# Patient Record
Sex: Female | Born: 1980
Health system: Southern US, Community
[De-identification: ages and names within clinical notes are randomized; demographics above are authoritative.]

## PROBLEM LIST (undated history)

## (undated) DIAGNOSIS — Z8669 Personal history of other diseases of the nervous system and sense organs: Secondary | ICD-10-CM

## (undated) DIAGNOSIS — G43909 Migraine, unspecified, not intractable, without status migrainosus: Secondary | ICD-10-CM

## (undated) DIAGNOSIS — Z8709 Personal history of other diseases of the respiratory system: Secondary | ICD-10-CM

## (undated) DIAGNOSIS — L709 Acne, unspecified: Secondary | ICD-10-CM

## (undated) DIAGNOSIS — F419 Anxiety disorder, unspecified: Secondary | ICD-10-CM

## (undated) DIAGNOSIS — Z8659 Personal history of other mental and behavioral disorders: Secondary | ICD-10-CM

## (undated) DIAGNOSIS — Z8619 Personal history of other infectious and parasitic diseases: Secondary | ICD-10-CM

## (undated) DIAGNOSIS — J45909 Unspecified asthma, uncomplicated: Secondary | ICD-10-CM

## (undated) DIAGNOSIS — T7840XA Allergy, unspecified, initial encounter: Secondary | ICD-10-CM

## (undated) DIAGNOSIS — N898 Other specified noninflammatory disorders of vagina: Secondary | ICD-10-CM

## (undated) DIAGNOSIS — O24419 Gestational diabetes mellitus in pregnancy, unspecified control: Secondary | ICD-10-CM

## (undated) DIAGNOSIS — F32A Depression, unspecified: Secondary | ICD-10-CM

## (undated) DIAGNOSIS — F329 Major depressive disorder, single episode, unspecified: Secondary | ICD-10-CM

## (undated) HISTORY — DX: Acne, unspecified: L70.9

## (undated) HISTORY — DX: Personal history of other diseases of the respiratory system: Z87.09

## (undated) HISTORY — DX: Personal history of other mental and behavioral disorders: Z86.59

## (undated) HISTORY — DX: Migraine, unspecified, not intractable, without status migrainosus: G43.909

## (undated) HISTORY — PX: WISDOM TOOTH EXTRACTION: SHX21

## (undated) HISTORY — DX: Depression, unspecified: F32.A

## (undated) HISTORY — DX: Other specified noninflammatory disorders of vagina: N89.8

## (undated) HISTORY — PX: TONSILLECTOMY: SUR1361

## (undated) HISTORY — DX: Personal history of other diseases of the nervous system and sense organs: Z86.69

## (undated) HISTORY — DX: Allergy, unspecified, initial encounter: T78.40XA

## (undated) HISTORY — DX: Personal history of other infectious and parasitic diseases: Z86.19

## (undated) HISTORY — DX: Unspecified asthma, uncomplicated: J45.909

## (undated) HISTORY — PX: ADENOIDECTOMY: SUR15

## (undated) HISTORY — DX: Gestational diabetes mellitus in pregnancy, unspecified control: O24.419

## (undated) HISTORY — DX: Major depressive disorder, single episode, unspecified: F32.9

## (undated) HISTORY — DX: Anxiety disorder, unspecified: F41.9

---

## 2007-06-05 ENCOUNTER — Ambulatory Visit: Payer: Self-pay | Admitting: Obstetrics and Gynecology

## 2007-06-26 HISTORY — PX: CHOLECYSTECTOMY: SHX55

## 2007-07-07 ENCOUNTER — Encounter: Payer: Self-pay | Admitting: Maternal & Fetal Medicine

## 2007-08-04 ENCOUNTER — Encounter: Payer: Self-pay | Admitting: Obstetrics and Gynecology

## 2007-10-20 ENCOUNTER — Encounter: Payer: Self-pay | Admitting: Maternal & Fetal Medicine

## 2007-12-11 ENCOUNTER — Encounter: Payer: Self-pay | Admitting: Maternal & Fetal Medicine

## 2007-12-25 ENCOUNTER — Encounter: Payer: Self-pay | Admitting: Obstetrics & Gynecology

## 2008-01-01 ENCOUNTER — Ambulatory Visit: Payer: Self-pay | Admitting: Obstetrics and Gynecology

## 2008-01-02 ENCOUNTER — Inpatient Hospital Stay: Payer: Self-pay | Admitting: Obstetrics and Gynecology

## 2008-01-25 ENCOUNTER — Other Ambulatory Visit: Payer: Self-pay

## 2008-01-25 ENCOUNTER — Emergency Department: Payer: Self-pay | Admitting: Emergency Medicine

## 2008-02-20 ENCOUNTER — Ambulatory Visit: Payer: Self-pay | Admitting: General Surgery

## 2008-02-23 ENCOUNTER — Ambulatory Visit: Payer: Self-pay | Admitting: General Surgery

## 2008-10-30 ENCOUNTER — Emergency Department: Payer: Self-pay | Admitting: Emergency Medicine

## 2011-02-15 ENCOUNTER — Encounter: Payer: Self-pay | Admitting: Obstetrics and Gynecology

## 2011-03-26 ENCOUNTER — Encounter: Payer: Self-pay | Admitting: Maternal and Fetal Medicine

## 2011-07-02 ENCOUNTER — Observation Stay: Payer: Self-pay | Admitting: Obstetrics and Gynecology

## 2011-07-02 LAB — URINALYSIS, COMPLETE
Bacteria: NONE SEEN
Bilirubin,UR: NEGATIVE
Blood: NEGATIVE
Glucose,UR: NEGATIVE mg/dL (ref 0–75)
Ketone: NEGATIVE
Leukocyte Esterase: NEGATIVE
Ph: 7 (ref 4.5–8.0)
Specific Gravity: 1.003 (ref 1.003–1.030)
Squamous Epithelial: 1

## 2011-07-02 LAB — FETAL FIBRONECTIN: Fetal Fibronectin: NEGATIVE

## 2011-07-25 ENCOUNTER — Observation Stay: Payer: Self-pay | Admitting: Obstetrics and Gynecology

## 2011-08-10 ENCOUNTER — Ambulatory Visit: Payer: Self-pay | Admitting: Obstetrics and Gynecology

## 2011-08-10 LAB — CBC WITH DIFFERENTIAL/PLATELET
Basophil #: 0 10*3/uL (ref 0.0–0.1)
Basophil %: 0.1 %
Eosinophil #: 0 10*3/uL (ref 0.0–0.7)
HCT: 39.2 % (ref 35.0–47.0)
Lymphocyte %: 15.9 %
MCHC: 33.7 g/dL (ref 32.0–36.0)
MCV: 89 fL (ref 80–100)
Monocyte #: 0.7 10*3/uL (ref 0.0–0.7)
Neutrophil #: 9.8 10*3/uL — ABNORMAL HIGH (ref 1.4–6.5)
RDW: 12.6 % (ref 11.5–14.5)
WBC: 12.5 10*3/uL — ABNORMAL HIGH (ref 3.6–11.0)

## 2011-08-13 ENCOUNTER — Inpatient Hospital Stay: Payer: Self-pay | Admitting: Obstetrics and Gynecology

## 2011-08-14 LAB — HEMATOCRIT: HCT: 32.2 % — ABNORMAL LOW (ref 35.0–47.0)

## 2013-07-02 LAB — HM PAP SMEAR: HM PAP: NEGATIVE

## 2014-10-17 NOTE — H&P (Signed)
PATIENT NAME:  Sherry Lara, Sherry Lara DATE OF BIRTH:  12/18/1980  DATE OF ADMISSION:  08/13/2011  PREOPERATIVE DIAGNOSES:  1. 39.1 week intrauterine pregnancy, undelivered.  2. History of previous Cesarean section x2.  3. 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  4. Anxiety/depression.   HISTORY OF PRESENT ILLNESS: Sherry Lara is a 34 year old married white female gravida 4, para 1-1-0-2, EDC 08/19/2011, EGA 39.[redacted] weeks gestation, who presents for repeat Cesarean section delivery. The patient's prenatal course has been notable for previous Cesarean section x2; first Cesarean section  was done for arrest of descent for 7 pound 7 ounce baby who was OP at delivery; second delivery was a repeat Cesarean section. The patient also has history of robertsonian translocation on chromosomes 13/14. Nuchal translucency testing was within normal limits. The patient does have anxiety/depression and has had increasing issues as the delivery is getting closer. The patient will require close monitoring of this problem at the time of Cesarean section delivery.   PAST MEDICAL HISTORY:  1. Migraines.  2. Asthma.  3. Anxiety/depression.  4. 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  5. History of gestational diabetes first baby.   PAST SURGICAL HISTORY:  1. Dilation and curettage. 2. Tonsillectomy and adenoidectomy.  3. Wisdom tooth extraction. 4. Cesarean section x2.  5. Laparoscopic cholecystectomy in 2009.   PAST OB HISTORY: Para 1-1-0-2.   FAMILY HISTORY: Negative for genetic issues other than the robertsonian translocation on chromosomes 13/14.   SOCIAL HISTORY: The patient does not smoke, does not drink, does not use drugs.   CURRENT MEDICATIONS: Prenatal vitamins.   DRUG ALLERGIES: Codeine, penicillin, Vicodin.   REVIEW OF SYSTEMS: The patient denies recent illness. The patient does not have history of coagulopathy.   PHYSICAL EXAMINATION:   VITAL  SIGNS: Height 5 feet 4 inches, weight 172.4, blood pressure 112/74, heart rate 84.   GENERAL: The patient is a pleasant well appearing white female who is anxious. She is alert and oriented.   NECK: Supple without thyromegaly or adenopathy.   LUNGS: Clear.   HEART: Regular rate and rhythm without murmur.   ABDOMEN: Soft, gravid, fundal height 35 cm, estimated fetal weight 7 pounds 4 ounces. Fetal heart tones are normal at 145. Cervix is closed 30% pelvis.   EXTREMITIES: Without clubbing, cyanosis, or edema.   SKIN: Without rash.   IMPRESSION:  1. 39.1 week intrauterine pregnancy, undelivered.  2. Previous Cesarean section delivery x2.  3. Anxiety/depression, worsening.   PLAN:  1. Repeat Cesarean section delivery.  2. Consider pharmacologic therapy for postpartum depression prophylaxis.  3. Date of surgery is 08/13/2011.   CONSENT NOTE: Sherry Lara is to undergo repeat Cesarean section delivery. She is understanding of the planned procedure and is aware of and accepting of all surgical risks which include, but are not limited to, bleeding, infection, pelvic organ injury with need for repair, blood clot disorders, anesthesia risks, and death. All questions are answered. Informed consent is given. The patient is ready and willing to proceed with surgery as scheduled.   ____________________________ Prentice DockerMartin A. Asir Bingley, MD mad:drc D: 08/09/2011 14:46:20 ET T: 08/09/2011 15:53:28 ET JOB#: [id number]  cc: Daphine DeutscherMartin A. Kyndell Zeiser, MD, <Dictator> Prentice DockerMARTIN A Dorell Gatlin MD ELECTRONICALLY SIGNED 08/10/2011 14:41

## 2014-10-17 NOTE — Op Note (Signed)
PATIENT NAME:  Sherry Lara, Sherry Lara MR#:  [id number] DATE OF BIRTH:  08-23-1980  DATE OF PROCEDURE:  08/13/2011  PREOPERATIVE DIAGNOSES:  1. 39.1 week intrauterine pregnancy, undelivered.  2. Previous cesarean section delivery x2.  3. History of Robertsonian translocation chromosome 13/14. 4. History of anxiety.   POSTOPERATIVE DIAGNOSES:  1. 39.1 week intrauterine pregnancy, delivered.  2. Prior cesarean section x2.  3. History of Robertsonian translocation chromosomes 13/14. 4. History of anxiety.  5. Viable female infant 7 pounds, 8 ounces.   OPERATIVE PROCEDURE: Repeat low cervical transverse cesarean section.   SURGEON: Alanda Slim. DeFrancesco, MD   FIRST ASSISTANT: Elgie Collard, MD  ANESTHESIA: Spinal.   INDICATIONS: The patient is a 34 year old married white female gravida 4, para 1-1-0-2, at 39.[redacted] weeks gestation, with history of previous cesarean section x2, who presents for repeat cesarean section delivery. Her prenatal course has been relatively uneventful. The patient has been seen in psychology recently because of increased anxiety with pending surgical delivery.   FINDINGS AT SURGERY: Viable female infant 7 pounds, 8 ounces having Apgars of 8 and 9 at one and five minutes, respectively. There was a nuchal cord x1 noted. There was a significant thin window in the lower uterine segment that was identified. Cord blood and tissue were harvested, per the patient's request.   DESCRIPTION OF PROCEDURE: The patient was brought to the Operating Room where she was placed in the sitting position. Spinal anesthetic was introduced without difficulty. She was placed in the supine position with a right lateral hip roll in place. A ChloraPrep abdominal and perineal prep and drape was performed in the standard fashion. A Foley catheter was placed. After checking for adequate levels of anesthesia, the previous C-section scar was excised sharply. A scalpel was used to dissect down to the  fascia. The fascia was extended bilaterally with Mayo scissors. The rectus muscle was dissected off of the fascia through sharp and blunt dissection. Midline raphe was identified and separated, and the peritoneum was entered. A significant window was identified in the lower uterine segment with fetal head clearly visible. Just above the window, a low transverse incision was made in the uterus and this was extended bluntly bilaterally with care not to injure the infant. The incision was extended bluntly bilaterally. The infant was delivered through a vertex presentation with nuchal cord being reduced x1. The infant was vigorous at birth. Upon delivery of the infant, the umbilical cord was doubly clamped and cut and the infant was handed off to the resuscitating team. The cord segment was obtained for harvesting. Cord blood was then obtained per the kit directions, labeled appropriately and given to the patient for pick-up via the Cord [place]. The placenta was expressed from the uterine cavity. The uterus was externalized onto the anterior abdominal wall and cleared of all debris with laps. The uterine incision was closed in one layer using #1 chromic suture in a running locking manner. Good hemostasis was obtained. The tubes and ovaries were noted to be normal. The uterus was placed back into the abdominal pelvic cavity. Some serosa overlying the bladder was reapproximated using 2-0 Vicryl in a simple running manner. The gutters within the abdomen were cleared of all debris with laps. The incision was closed in layers with 0 Maxon being used on the fascia in a simple running manner. The skin was closed with staples, pressure dressing was applied, and the patient was then mobilized and taken to the Recovery Room in satisfactory condition. Estimated  blood loss was 500 mL. All instruments, needle, and sponge counts were verified as correct. The patient did receive Ancef 2 grams antibiotic prophylaxis  preoperatively.  ____________________________ Alanda Slim DeFrancesco, MD mad:slb D: 08/13/2011 16:37:00 ET T: 08/13/2011 16:58:49 ET JOB#: [id number]  cc: Hassell Done A. DeFrancesco, MD, <Dictator> Shelby Mattocks. Georga Bora, MD Alanda Slim DEFRANCESCO MD ELECTRONICALLY SIGNED 08/20/2011 16:02

## 2014-11-02 NOTE — H&P (Signed)
L&D Evaluation:  History:   HPI 36.3 week Intrauterine pregnancy; prior C/S x2    Presents with contractions    Patient's Medical History GDM last pregnancy    Patient's Surgical History Previous C-Section    Medications Pre Natal Vitamins    Allergies PCN, Codeine, Vicodin    Social History none    Family History Non-Contributory   ROS:   ROS All systems were reviewed.  HEENT, CNS, GI, GU, Respiratory, CV, Renal and Musculoskeletal systems were found to be normal.   Exam:   Vital Signs stable    Urine Protein not completed    General no apparent distress    Heart normal sinus rhythm    Abdomen gravid, non-tender    Estimated Fetal Weight Average for gestational age    Edema no edema    Pelvic no external lesions, 50/closed/-3    FHT Reactrive NST   Impression:   Impression 36.2 week Intrauterine pregnancy; Irregular Ctx's, no Labor; Prior C/S x 2   Plan:   Plan discharge    Follow Up Appointment Tomorrow as scheduled.   Electronic Signatures: Corlene Sabia, Prentice DockerMartin A (MD)  (Signed 30-Jan-13 10:20)  Authored: L&D Evaluation   Last Updated: 30-Jan-13 10:20 by Swade Shonka, Prentice DockerMartin A (MD)

## 2014-11-24 ENCOUNTER — Ambulatory Visit: Payer: Self-pay | Admitting: Primary Care

## 2014-11-24 ENCOUNTER — Ambulatory Visit (INDEPENDENT_AMBULATORY_CARE_PROVIDER_SITE_OTHER): Payer: BLUE CROSS/BLUE SHIELD | Admitting: Internal Medicine

## 2014-11-24 ENCOUNTER — Encounter: Payer: Self-pay | Admitting: Internal Medicine

## 2014-11-24 VITALS — BP 110/74 | HR 73 | Temp 98.3°F | Wt 141.0 lb

## 2014-11-24 DIAGNOSIS — J029 Acute pharyngitis, unspecified: Secondary | ICD-10-CM

## 2014-11-24 DIAGNOSIS — R52 Pain, unspecified: Secondary | ICD-10-CM

## 2014-11-24 LAB — POCT RAPID STREP A (OFFICE): Rapid Strep A Screen: NEGATIVE

## 2014-11-24 NOTE — Progress Notes (Signed)
Subjective:    Patient ID: Sherry Lara, female    DOB: 1980-12-30, 34 y.o.   MRN: [id number]  HPI  Pt presents to the clinic today with c/o sore throat and body aches. This started 4 days ago. She has noticed some swelling in her glands. She denies fever or chills. She reports she was treated for Strep throat back in April, and her son was recently treated for Strep 2 weeks ago. 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 She has no history of allergies or breathing problems. She has not tried anything OTC.  Review of Systems      No past medical history on file.  No current outpatient prescriptions on file.   No current facility-administered medications for this visit.    Allergies  Allergen Reactions  . Vicodin [Hydrocodone-Acetaminophen] Nausea And Vomiting  . Codeine Rash  . Penicillins Rash    No family history on file.  History   Social History  . Marital Status: Married    Spouse Name: N/A  . Number of Children: N/A  . Years of Education: N/A   Occupational History  . Not on file.   Social History Main Topics  . Smoking status: Never Smoker   . Smokeless tobacco: Not on file  . Alcohol Use: 0.0 oz/week    0 Standard drinks or equivalent per week     Comment: occasional  . Drug Use: Not on file  . Sexual Activity: Not on file   Other Topics Concern  . Not on file   Social History Narrative  . No narrative on file     Constitutional: Pt reports body aches. Denies fever, malaise, fatigue, headache or abrupt weight changes.  HEENT: Pt reports sore throat. Denies eye pain, eye redness, ear pain, ringing in the ears, wax buildup, runny nose, nasal congestion, bloody nose. Respiratory: Denies difficulty breathing, shortness of breath, cough or sputum production.   Cardiovascular: Denies chest pain, chest tightness, palpitations or swelling in the hands or feet.   No other specific complaints in a complete review of systems  (except as listed in HPI above).  Objective:   Physical Exam  BP 110/74 mmHg  Pulse 73  Temp(Src) 98.3 F (36.8 C) (Oral)  Wt 141 lb (63.957 kg)  SpO2 98%  LMP 11/09/2014 Wt Readings from Last 3 Encounters:  11/24/14 141 lb (63.957 kg)    General: Appears her stated age, well developed, well nourished in NAD. Skin: Warm, dry and intact. No rashes, lesions or ulcerations noted. HEENT: Head: normal shape and size, no sinus tenderness noted; Eyes: sclera white, no icterus, conjunctiva pink; Ears: Tm's gray and intact, normal light reflex; Nose: mucosa pink and moist, septum midline; Throat/Mouth: Teeth present, mucosa erythematous and moist, no exudate, lesions or ulcerations noted.  Neck: Cervical adenopathy noted on the right. Cardiovascular: Normal rate and rhythm. S1,S2 noted.  No murmur, rubs or gallops noted.  Pulmonary/Chest: Normal effort and positive vesicular breath sounds. No respiratory distress. No wheezes, rales or ronchi noted.   BMET No results found for: NA, K, CL, CO2, GLUCOSE, BUN, CREATININE, CALCIUM, GFRNONAA, GFRAA  Lipid Panel  No results found for: CHOL, TRIG, HDL, CHOLHDL, VLDL, LDLCALC  CBC    Component Value Date/Time   WBC 12.5* 08/10/2011 0835   RBC 4.42 08/10/2011 0835   HGB 13.2 08/10/2011 0835   HCT 32.2* 08/14/2011 0602   PLT 238 08/10/2011 0835   MCV 89 08/10/2011 0835  MCH 29.8 08/10/2011 0835   MCHC 33.7 08/10/2011 0835   RDW 12.6 08/10/2011 0835   LYMPHSABS 2.0 08/10/2011 0835   MONOABS 0.7 08/10/2011 0835   EOSABS 0.0 08/10/2011 0835   BASOSABS 0.0 08/10/2011 0835    Hgb A1C No results found for: HGBA1C       Assessment & Plan:   Sore throat and body aches:  I think this may be allergies RST: negative She insist on being tested for Mono, blood work ordered Advised her to start taking Zyrtec daily x 1-2 weeks Salt water gargles and Ibuprofen for sore throat  Will call you with lab results, RTC as needed

## 2014-11-24 NOTE — Progress Notes (Signed)
Pre visit review using our clinic review tool, if applicable. No additional management support is needed unless otherwise documented below in the visit note. 

## 2014-11-24 NOTE — Addendum Note (Signed)
Addended by: Baldomero LamyHAVERS, Sherrina Zaugg C on: 11/24/2014 03:40 PM   Modules accepted: Kipp BroodSmartSet

## 2014-11-24 NOTE — Addendum Note (Signed)
Addended by: Roena MaladyEVONTENNO, MELANIE Y on: 11/24/2014 04:11 PM   Modules accepted: Orders

## 2014-11-24 NOTE — Addendum Note (Signed)
Addended by: Lorre MunroeBAITY, Kennedy Brines W on: 11/24/2014 05:17 PM   Modules accepted: Kipp BroodSmartSet

## 2014-11-24 NOTE — Patient Instructions (Addendum)
Strep test negative We will do a blood test for Mono and call you with the results Start Zyrtec at night x 1-2 weeks Ibuprofen as needed for sore throat  Allergic Rhinitis Allergic rhinitis is when the mucous membranes in the nose respond to allergens. Allergens are particles in the air that cause your body to have an allergic reaction. This causes you to release allergic antibodies. Through a chain of events, these eventually cause you to release histamine into the blood stream. Although meant to protect the body, it is this release of histamine that causes your discomfort, such as frequent sneezing, congestion, and an itchy, runny nose.  CAUSES  Seasonal allergic rhinitis (hay fever) is caused by pollen allergens that may come from grasses, trees, and weeds. Year-round allergic rhinitis (perennial allergic rhinitis) is caused by allergens such as house dust mites, pet dander, and mold spores.  SYMPTOMS   Nasal stuffiness (congestion).  Itchy, runny nose with sneezing and tearing of the eyes. DIAGNOSIS  Your health care provider can help you determine the allergen or allergens that trigger your symptoms. If you and your health care provider are unable to determine the allergen, skin or blood testing may be used. TREATMENT  Allergic rhinitis does not have a cure, but it can be controlled by:  Medicines and allergy shots (immunotherapy).  Avoiding the allergen. Hay fever may often be treated with antihistamines in pill or nasal spray forms. Antihistamines block the effects of histamine. There are over-the-counter medicines that may help with nasal congestion and swelling around the eyes. Check with your health care provider before taking or giving this medicine.  If avoiding the allergen or the medicine prescribed do not work, there are many new medicines your health care provider can prescribe. Stronger medicine may be used if initial measures are ineffective. Desensitizing injections can be  used if medicine and avoidance does not work. Desensitization is when a patient is given ongoing shots until the body becomes less sensitive to the allergen. Make sure you follow up with your health care provider if problems continue. HOME CARE INSTRUCTIONS It is not possible to completely avoid allergens, but you can reduce your symptoms by taking steps to limit your exposure to them. It helps to know exactly what you are allergic to so that you can avoid your specific triggers. SEEK MEDICAL CARE IF:   You have a fever.  You develop a cough that does not stop easily (persistent).  You have shortness of breath.  You start wheezing.  Symptoms interfere with normal daily activities. Document Released: 03/06/2001 Document Revised: 06/16/2013 Document Reviewed: 02/16/2013 [facility] [employer] Patient Information 2015 [employer] [place]. This information is not intended to replace advice given to you by your health care provider. Make sure you discuss any questions you have with your health care provider.

## 2014-11-25 LAB — MONONUCLEOSIS SCREEN: MONO SCREEN: NEGATIVE

## 2015-03-25 ENCOUNTER — Encounter: Payer: Self-pay | Admitting: Family Medicine

## 2015-03-25 ENCOUNTER — Ambulatory Visit (INDEPENDENT_AMBULATORY_CARE_PROVIDER_SITE_OTHER): Payer: BLUE CROSS/BLUE SHIELD | Admitting: Family Medicine

## 2015-03-25 VITALS — BP 102/70 | HR 60 | Temp 98.0°F | Ht 65.25 in | Wt 142.0 lb

## 2015-03-25 DIAGNOSIS — Z0000 Encounter for general adult medical examination without abnormal findings: Secondary | ICD-10-CM

## 2015-03-25 DIAGNOSIS — Z13220 Encounter for screening for lipoid disorders: Secondary | ICD-10-CM

## 2015-03-25 DIAGNOSIS — Z131 Encounter for screening for diabetes mellitus: Secondary | ICD-10-CM

## 2015-03-25 DIAGNOSIS — Z23 Encounter for immunization: Secondary | ICD-10-CM | POA: Diagnosis not present

## 2015-03-25 DIAGNOSIS — Z8709 Personal history of other diseases of the respiratory system: Secondary | ICD-10-CM | POA: Insufficient documentation

## 2015-03-25 NOTE — Progress Notes (Signed)
Pre visit review using our clinic review tool, if applicable. No additional management support is needed unless otherwise documented below in the visit note. 

## 2015-03-25 NOTE — Progress Notes (Signed)
BP 102/70 mmHg  Pulse 60  Temp(Src) 98 F (36.7 C) (Oral)  Ht 5' 5.25" (1.657 m)  Wt 142 lb (64.411 kg)  BMI 23.46 kg/m2  LMP 03/05/2015   CC: new pt to establish care  Subjective:    Patient ID: Sherry Lara, female    DOB: 08-Jul-1980, 34 y.o.   MRN: [id number]  HPI: Sherry Lara is a 34 y.o. female presenting on 03/25/2015 for Establish Care   Wife of Sanjuan Dame. Flu shot today. Seen here 11/24/2014 for sore throat.  Preventative: Yearly for insurance purposes Last meal was oatmeal and eggs 7:30am this morning. Well woman - 06/2014 with Dr Avanell Shackleton [facility], normal paps. LMP 03/05/2015. G4P3 Flu shot today Tetanus - 06/2014 Seat belt use discussed Sunscreen use discussed. 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 One daughter is vegetarian  Relevant past medical, surgical, family and social history reviewed and updated as indicated. Interim medical history since our last visit reviewed. Allergies and medications reviewed and updated. No current outpatient prescriptions on file prior to visit.   No current facility-administered medications on file prior to visit.    Review of Systems  Constitutional: Negative for fever, chills, activity change, appetite change, fatigue and unexpected weight change.  HENT: Negative for hearing loss.   Eyes: Negative for visual disturbance.  Respiratory: Negative for cough, chest tightness, shortness of breath and wheezing.   Cardiovascular: Negative for chest pain, palpitations and leg swelling.  Gastrointestinal: Negative for nausea, vomiting, abdominal pain, diarrhea, constipation, blood in stool and abdominal distention.  Genitourinary: Negative for hematuria and difficulty urinating.  Musculoskeletal: Negative for myalgias, arthralgias  and neck pain.  Skin: Negative for rash.  Neurological: Positive for headaches. Negative for dizziness, seizures and syncope.  Hematological: Negative for adenopathy. Does not bruise/bleed easily.  Psychiatric/Behavioral: Negative for dysphoric mood. The patient is not nervous/anxious.    Per HPI unless specifically indicated above     Objective:    BP 102/70 mmHg  Pulse 60  Temp(Src) 98 F (36.7 C) (Oral)  Ht 5' 5.25" (1.657 m)  Wt 142 lb (64.411 kg)  BMI 23.46 kg/m2  LMP 03/05/2015  Wt Readings from Last 3 Encounters:  03/25/15 142 lb (64.411 kg)  11/24/14 141 lb (63.957 kg)    Physical Exam  Constitutional: She is oriented to person, place, and time. She appears well-developed and well-nourished. No distress.  HENT:  Head: Normocephalic and atraumatic.  Right Ear: Hearing, tympanic membrane, external ear and ear canal normal.  Left Ear: Hearing, tympanic membrane, external ear and ear canal normal.  Nose: Nose normal.  Mouth/Throat: Uvula is midline, oropharynx is clear and moist and mucous membranes are normal. No oropharyngeal exudate, posterior oropharyngeal edema or posterior oropharyngeal erythema.  Eyes: Conjunctivae and EOM are normal. Pupils are equal, round, and reactive to light. No scleral icterus.  Neck: Normal range of motion. Neck supple.  Cardiovascular: Normal rate, regular rhythm, normal heart sounds and intact distal pulses.   No murmur heard. Pulses:      Radial pulses are 2+ on the right side, and 2+ on the left side.  Pulmonary/Chest: Effort normal and breath sounds normal. No respiratory distress. She has no wheezes. She has no rales.  Abdominal: Soft. Bowel sounds are normal. She exhibits no distension and no mass. There is no tenderness. There is no  rebound and no guarding.  Musculoskeletal: Normal range of motion. She exhibits no edema.  Lymphadenopathy:    She has no cervical adenopathy.  Neurological: She is alert and oriented to person, place,  and time.  CN grossly intact, station and gait intact  Skin: Skin is warm and dry. No rash noted.  Psychiatric: She has a normal mood and affect. Her behavior is normal. Judgment and thought content normal.  Nursing note and vitals reviewed.     Assessment & Plan:   Problem List Items Addressed This Visit    Health maintenance examination - Primary    Preventative protocols reviewed and updated unless pt declined. Discussed healthy diet and lifestyle.        Other Visit Diagnoses    Need for influenza vaccination        Relevant Orders    Flu Vaccine QUAD 36+ mos PF IM (Fluarix & Fluzone Quad PF)    Lipid screening        Relevant Orders    Lipid panel    Diabetes mellitus screening        Relevant Orders    Basic metabolic panel        Follow up plan: Return in about 1 year (around 03/24/2016), or as needed, for annual exam, prior fasting for blood work.

## 2015-03-25 NOTE — Patient Instructions (Addendum)
Nice to meet you today, call us with questions. Return as needed or in 1 year for physical. Return at your convenience for fasting labs.  Health Maintenance Adopting a healthy lifestyle and getting preventive care can go a long way to promote health and wellness. Talk with your health care provider about what schedule of regular examinations is right for you. This is a good chance for you to check in with your provider about disease prevention and staying healthy. In between checkups, there are plenty of things you can do on your own. Experts have done a lot of research about which lifestyle changes and preventive measures are most likely to keep you healthy. Ask your health care provider for more information. WEIGHT AND DIET  Eat a healthy diet  Be sure to include plenty of vegetables, fruits, low-fat dairy products, and lean protein.  Do not eat a lot of foods high in solid fats, added sugars, or salt.  Get regular exercise. This is one of the most important things you can do for your health.  Most adults should exercise for at least 150 minutes each week. The exercise should increase your heart rate and make you sweat (moderate-intensity exercise).  Most adults should also do strengthening exercises at least twice a week. This is in addition to the moderate-intensity exercise.  Maintain a healthy weight  Body mass index (BMI) is a measurement that can be used to identify possible weight problems. It estimates body fat based on height and weight. Your health care provider can help determine your BMI and help you achieve or maintain a healthy weight.  For females 52 years of age and older:   A BMI below 18.5 is considered underweight.  A BMI of 18.5 to 24.9 is normal.  A BMI of 25 to 29.9 is considered overweight.  A BMI of 30 and above is considered obese.  Watch levels of cholesterol and blood lipids  You should start having your blood tested for lipids and cholesterol at 35  years of age, then have this test every 5 years.  You may need to have your cholesterol levels checked more often if:  Your lipid or cholesterol levels are high.  You are older than 35 years of age.  You are at high risk for heart disease.  CANCER SCREENING   Lung Cancer  Lung cancer screening is recommended for adults 50-84 years old who are at high risk for lung cancer because of a history of smoking.  A yearly low-dose CT scan of the lungs is recommended for people who:  Currently smoke.  Have quit within the past 15 years.  Have at least a 30-pack-year history of smoking. A pack year is smoking an average of one pack of cigarettes a day for 1 year.  Yearly screening should continue until it has been 15 years since you quit.  Yearly screening should stop if you develop a health problem that would prevent you from having lung cancer treatment.  Breast Cancer  Practice breast self-awareness. This means understanding how your breasts normally appear and feel.  It also means doing regular breast self-exams. Let your health care provider know about any changes, no matter how small.  If you are in your 20s or 30s, you should have a clinical breast exam (CBE) by a health care provider every 1-3 years as part of a regular health exam.  If you are 38 or older, have a CBE every year. Also consider having a breast X-ray (  mammogram) every year.  If you have a family history of breast cancer, talk to your health care provider about genetic screening.  If you are at high risk for breast cancer, talk to your health care provider about having an MRI and a mammogram every year.  Breast cancer gene (BRCA) assessment is recommended for women who have family members with BRCA-related cancers. BRCA-related cancers include:  Breast.  Ovarian.  Tubal.  Peritoneal cancers.  Results of the assessment will determine the need for genetic counseling and BRCA1 and BRCA2 testing. Cervical  Cancer Routine pelvic examinations to screen for cervical cancer are no longer recommended for nonpregnant women who are considered low risk for cancer of the pelvic organs (ovaries, uterus, and vagina) and who do not have symptoms. A pelvic examination may be necessary if you have symptoms including those associated with pelvic infections. Ask your health care provider if a screening pelvic exam is right for you.   The Pap test is the screening test for cervical cancer for women who are considered at risk.  If you had a hysterectomy for a problem that was not cancer or a condition that could lead to cancer, then you no longer need Pap tests.  If you are older than 65 years, and you have had normal Pap tests for the past 10 years, you no longer need to have Pap tests.  If you have had past treatment for cervical cancer or a condition that could lead to cancer, you need Pap tests and screening for cancer for at least 20 years after your treatment.  If you no longer get a Pap test, assess your risk factors if they change (such as having a new sexual partner). This can affect whether you should start being screened again.  Some women have medical problems that increase their chance of getting cervical cancer. If this is the case for you, your health care provider may recommend more frequent screening and Pap tests.  The human papillomavirus (HPV) test is another test that may be used for cervical cancer screening. The HPV test looks for the virus that can cause cell changes in the cervix. The cells collected during the Pap test can be tested for HPV.  The HPV test can be used to screen women 30 years of age and older. Getting tested for HPV can extend the interval between normal Pap tests from three to five years.  An HPV test also should be used to screen women of any age who have unclear Pap test results.  After 35 years of age, women should have HPV testing as often as Pap tests.  Colorectal  Cancer  This type of cancer can be detected and often prevented.  Routine colorectal cancer screening usually begins at 35 years of age and continues through 35 years of age.  Your health care provider may recommend screening at an earlier age if you have risk factors for colon cancer.  Your health care provider may also recommend using home test kits to check for hidden blood in the stool.  A small camera at the end of a tube can be used to examine your colon directly (sigmoidoscopy or colonoscopy). This is done to check for the earliest forms of colorectal cancer.  Routine screening usually begins at age 50.  Direct examination of the colon should be repeated every 5-10 years through 35 years of age. However, you may need to be screened more often if early forms of precancerous polyps or small growths   are found. Skin Cancer  Check your skin from head to toe regularly.  Tell your health care provider about any new moles or changes in moles, especially if there is a change in a mole's shape or color.  Also tell your health care provider if you have a mole that is larger than the size of a pencil eraser.  Always use sunscreen. Apply sunscreen liberally and repeatedly throughout the day.  Protect yourself by wearing long sleeves, pants, a wide-brimmed hat, and sunglasses whenever you are outside. HEART DISEASE, DIABETES, AND HIGH BLOOD PRESSURE   Have your blood pressure checked at least every 1-2 years. High blood pressure causes heart disease and increases the risk of stroke.  If you are between 55 years and 79 years old, ask your health care provider if you should take aspirin to prevent strokes.  Have regular diabetes screenings. This involves taking a blood sample to check your fasting blood sugar level.  If you are at a normal weight and have a low risk for diabetes, have this test once every three years after 35 years of age.  If you are overweight and have a high risk for  diabetes, consider being tested at a younger age or more often. PREVENTING INFECTION  Hepatitis B  If you have a higher risk for hepatitis B, you should be screened for this virus. You are considered at high risk for hepatitis B if:  You were born in a country where hepatitis B is common. Ask your health care provider which countries are considered high risk.  Your parents were born in a high-risk country, and you have not been immunized against hepatitis B (hepatitis B vaccine).  You have HIV or AIDS.  You use needles to inject street drugs.  You live with someone who has hepatitis B.  You have had sex with someone who has hepatitis B.  You get hemodialysis treatment.  You take certain medicines for conditions, including cancer, organ transplantation, and autoimmune conditions. Hepatitis C  Blood testing is recommended for:  Everyone born from 1945 through 1965.  Anyone with known risk factors for hepatitis C. Sexually transmitted infections (STIs)  You should be screened for sexually transmitted infections (STIs) including gonorrhea and chlamydia if:  You are sexually active and are younger than 35 years of age.  You are older than 35 years of age and your health care provider tells you that you are at risk for this type of infection.  Your sexual activity has changed since you were last screened and you are at an increased risk for chlamydia or gonorrhea. Ask your health care provider if you are at risk.  If you do not have HIV, but are at risk, it may be recommended that you take a prescription medicine daily to prevent HIV infection. This is called pre-exposure prophylaxis (PrEP). You are considered at risk if:  You are sexually active and do not regularly use condoms or know the HIV status of your partner(s).  You take drugs by injection.  You are sexually active with a partner who has HIV. Talk with your health care provider about whether you are at high risk of  being infected with HIV. If you choose to begin PrEP, you should first be tested for HIV. You should then be tested every 3 months for as long as you are taking PrEP.  PREGNANCY   If you are premenopausal and you may become pregnant, ask your health care provider about preconception counseling.    If you may become pregnant, take 400 to 800 micrograms (mcg) of folic acid every day.  If you want to prevent pregnancy, talk to your health care provider about birth control (contraception). OSTEOPOROSIS AND MENOPAUSE   Osteoporosis is a disease in which the bones lose minerals and strength with aging. This can result in serious bone fractures. Your risk for osteoporosis can be identified using a bone density scan.  If you are 65 years of age or older, or if you are at risk for osteoporosis and fractures, ask your health care provider if you should be screened.  Ask your health care provider whether you should take a calcium or vitamin D supplement to lower your risk for osteoporosis.  Menopause may have certain physical symptoms and risks.  Hormone replacement therapy may reduce some of these symptoms and risks. Talk to your health care provider about whether hormone replacement therapy is right for you.  HOME CARE INSTRUCTIONS   Schedule regular health, dental, and eye exams.  Stay current with your immunizations.   Do not use any tobacco products including cigarettes, chewing tobacco, or electronic cigarettes.  If you are pregnant, do not drink alcohol.  If you are breastfeeding, limit how much and how often you drink alcohol.  Limit alcohol intake to no more than 1 drink per day for nonpregnant women. One drink equals 12 ounces of beer, 5 ounces of wine, or 1 ounces of hard liquor.  Do not use street drugs.  Do not share needles.  Ask your health care provider for help if you need support or information about quitting drugs.  Tell your health care provider if you often feel  depressed.  Tell your health care provider if you have ever been abused or do not feel safe at home. Document Released: 12/25/2010 Document Revised: 10/26/2013 Document Reviewed: 05/13/2013 Exit[employer] Patient Information 2015 [employer]. This information is not intended to replace advice given to you by your health care provider. Make sure you discuss any questions you have with your health care provider.  

## 2015-03-25 NOTE — Assessment & Plan Note (Signed)
Preventative protocols reviewed and updated unless pt declined. Discussed healthy diet and lifestyle.  

## 2015-03-29 ENCOUNTER — Other Ambulatory Visit: Payer: BLUE CROSS/BLUE SHIELD

## 2015-07-12 ENCOUNTER — Encounter: Payer: Self-pay | Admitting: Obstetrics and Gynecology

## 2015-12-01 ENCOUNTER — Encounter: Payer: Self-pay | Admitting: Primary Care

## 2015-12-01 ENCOUNTER — Ambulatory Visit (INDEPENDENT_AMBULATORY_CARE_PROVIDER_SITE_OTHER): Payer: BLUE CROSS/BLUE SHIELD | Admitting: Primary Care

## 2015-12-01 VITALS — BP 112/76 | HR 62 | Temp 97.2°F | Ht 65.25 in | Wt 134.8 lb

## 2015-12-01 DIAGNOSIS — H938X1 Other specified disorders of right ear: Secondary | ICD-10-CM | POA: Diagnosis not present

## 2015-12-01 MED ORDER — PREDNISONE 10 MG PO TABS: ORAL_TABLET | ORAL | Status: DC

## 2015-12-01 NOTE — Patient Instructions (Signed)
Start Prednisone tablets for ear pressure/discomfort. Take 3 tablets for 2 days, 2 tablets for 2 days, then 1 tablet for 2 days.  Continue Flonase and Claritin, do not take ibuprofen.  Please notify me if no improvement after completion of your prednisone.  It was a pleasure meeting you!

## 2015-12-01 NOTE — Progress Notes (Signed)
Subjective:    Patient ID: Sherry Lara, female    DOB: 07/23/1980, 35 y.o.   MRN: [id number]  HPI  Ms. Lara is a 35 year old female who presents today with a chief complaint of ear pain/pressure. Her pain is located to the right ear and has been present Since 11/20/15. She was evaluated at urgent care on 05/29 and was provided with a prescription of Azithromycin. Her pain/pressure improved, but never resolved. Tuesday night this week her symptoms started to increase. Denies fevers, sore throat, cough. She does notice postnasal drip. She's taken Claritin at night, Mucinex, Flonase without improvement. Denies sick contacts.   Review of Systems  Constitutional: Negative for fever.  HENT: Positive for ear pain and postnasal drip. Negative for congestion, sinus pressure and sore throat.   Respiratory: Negative for cough.        Past Medical History  Diagnosis Date  . History of asthma     childhood  . History of chicken pox   . History of depression     and anxiety; prior on wellbutrin/celexa  . Hx of migraines     improved after pregnancies  . Asthma   . Anxiety   . Depression   . GDM (gestational diabetes mellitus)   . Vaginal irritation   . Migraines      Social History   Social History  . Marital Status: Married    Spouse Name: N/A  . Number of Children: N/A  . Years of Education: N/A   Occupational History  . Not on file.   Social History Main Topics  . Smoking status: Never Smoker   . Smokeless tobacco: Never Used  . Alcohol Use: 0.0 oz/week    0 Standard drinks or equivalent per week     Comment: occasional  . Drug Use: No  . Sexual Activity:    Partners: Male    Birth Control/ Protection: Pill   Other Topics Concern  . Not on file   Social History Narrative   "[name]"   Lives with husband Renato GailsReed and 3 children, 1 dog   Occupation: Horticulturist, commercialpsychologist/counselor works at [employer]   Edu: [employer]   Activity: runs, crossfit   Diet: good water,  fruits/vegetables daily. One daughter is vegetarian    Past Surgical History  Procedure Laterality Date  . Cholecystectomy  2009  . Tonsillectomy  ~1992  . Cesarean section  2008,2009,2013  . Adenoidectomy    . Wisdom tooth extraction      Family History  Problem Relation Age of Onset  . Hypertension Mother   . Hypertension Father   . Hyperlipidemia Father     possibly  . CAD Neg Hx   . Stroke Neg Hx   . Diabetes Neg Hx   . Cancer Neg Hx     Allergies  Allergen Reactions  . Vicodin [Hydrocodone-Acetaminophen] Nausea And Vomiting  . Codeine Rash  . Penicillins Rash    No current outpatient prescriptions on file prior to visit.   No current facility-administered medications on file prior to visit.    BP 112/76 mmHg  Pulse 62  Temp(Src) 97.2 F (36.2 C) (Oral)  Ht 5' 5.25" (1.657 m)  Wt 134 lb 12.8 oz (61.145 kg)  BMI 22.27 kg/m2  SpO2 99%  LMP 11/10/2015 (Within Days)    Objective:   Physical Exam  Constitutional: She appears well-nourished.  HENT:  Right Ear: Ear canal normal. Tympanic membrane is bulging. Tympanic membrane is not erythematous.  Left  Ear: Tympanic membrane and ear canal normal.  Nose: Right sinus exhibits no maxillary sinus tenderness and no frontal sinus tenderness. Left sinus exhibits no maxillary sinus tenderness and no frontal sinus tenderness.  Mouth/Throat: Oropharynx is clear and moist.  Eyes: Conjunctivae are normal.  Neck: Neck supple.  Cardiovascular: Normal rate and regular rhythm.   Pulmonary/Chest: Effort normal and breath sounds normal. She has no wheezes. She has no rales.  Lymphadenopathy:    She has no cervical adenopathy.  Skin: Skin is warm and dry.          Assessment & Plan:  Ear pain/pressure:  Located to right ear since late May 2017. Treated with azithromycin 7 day course for otitis media. Exam today without evidence of infection, bulging without erythema noted to right TM. Suspect eustachian tube  involvement. Will trial low-dose prednisone taper over the next 6 days to help with pressure/pain. Continue Flonase and Claritin. Use Tylenol as needed for pain. Discussed not to use ibuprofen with prednisone. She is to notify me if no improvement after she completes her course of prednisone.

## 2015-12-01 NOTE — Progress Notes (Signed)
Pre visit review using our clinic review tool, if applicable. No additional management support is needed unless otherwise documented below in the visit note. 

## 2015-12-07 ENCOUNTER — Encounter: Payer: Self-pay | Admitting: Obstetrics and Gynecology

## 2015-12-21 ENCOUNTER — Encounter: Payer: Self-pay | Admitting: Obstetrics and Gynecology

## 2015-12-22 ENCOUNTER — Encounter: Payer: Self-pay | Admitting: Obstetrics and Gynecology

## 2015-12-22 ENCOUNTER — Ambulatory Visit (INDEPENDENT_AMBULATORY_CARE_PROVIDER_SITE_OTHER): Payer: BLUE CROSS/BLUE SHIELD | Admitting: Obstetrics and Gynecology

## 2015-12-22 VITALS — BP 128/79 | HR 62 | Ht 64.0 in | Wt 135.3 lb

## 2015-12-22 DIAGNOSIS — Z0000 Encounter for general adult medical examination without abnormal findings: Secondary | ICD-10-CM | POA: Diagnosis not present

## 2015-12-22 DIAGNOSIS — Z01419 Encounter for gynecological examination (general) (routine) without abnormal findings: Secondary | ICD-10-CM

## 2015-12-22 NOTE — Progress Notes (Signed)
Patient ID: Sherry Lara, female   DOB: 01/04/1981, 35 y.o.   MRN: [id number] ANNUAL PREVENTATIVE CARE GYN  ENCOUNTER NOTE  Subjective:       Sherry Lara is a 35 y.o. [id number] female here for a routine annual gynecologic exam.  Current complaints: 1.  Line on stomach- not painful- stomach muscle separataed    Gynecologic History Patient's last menstrual period was 12/12/2015 (exact date). Contraception: vasectomy Last Pap: 06/2013 neg/neg. Results were: normal Last mammogram: n/a. Results were: abnormal  Obstetric History OB History  Gravida Para Term Preterm AB SAB TAB Ectopic Multiple Living  4 2 2  1 1    3     # Outcome Date GA Lbr Len/2nd Weight Sex Delivery Anes PTL Lv  4 SAB           3 Term      CS-LTranv   Y  2 Term      CS-LTranv   Y  1 Gravida      CS-LTranv   Y      Past Medical History  Diagnosis Date  . History of asthma     childhood  . History of chicken pox   . History of depression     and anxiety; prior on wellbutrin/celexa  . Hx of migraines     improved after pregnancies  . Asthma   . Anxiety   . Depression   . GDM (gestational diabetes mellitus)   . Vaginal irritation   . Migraines     Past Surgical History  Procedure Laterality Date  . Cholecystectomy  2009  . Tonsillectomy  ~1992  . Cesarean section  2008,2009,2013  . Adenoidectomy    . Wisdom tooth extraction      No current outpatient prescriptions on file prior to visit.   No current facility-administered medications on file prior to visit.    Allergies  Allergen Reactions  . Vicodin [Hydrocodone-Acetaminophen] Nausea And Vomiting  . Codeine Rash  . Penicillins Rash    Social History   Social History  . Marital Status: Married    Spouse Name: N/A  . Number of Children: N/A  . Years of Education: N/A   Occupational History  . Not on file.   Social History Main Topics  . Smoking status: Never Smoker   . Smokeless tobacco: Never Used  .  Alcohol Use: 0.0 oz/week    0 Standard drinks or equivalent per week     Comment: occasional  . Drug Use: No  . Sexual Activity:    Partners: Male     Comment: vasectomy   Other Topics Concern  . Not on file   Social History Narrative   "Sherry Lara"   Lives with husband Renato GailsReed and 3 children, 1 dog   Occupation: Horticulturist, commercialpsychologist/counselor works at [employer]   Edu: [employer]   Activity: runs, crossfit   Diet: good water, fruits/vegetables daily. One daughter is vegetarian    Family History  Problem Relation Age of Onset  . Hypertension Mother   . Hypertension Father   . Hyperlipidemia Father     possibly  . CAD Neg Hx   . Stroke Neg Hx   . Diabetes Neg Hx   . Cancer Neg Hx     The following portions of the patient's history were reviewed and updated as appropriate: allergies, current medications, past family history, past medical history, past social history, past surgical history and problem list.  Review of Systems ROS Review of Systems -  General ROS: negative for - chills, fatigue, fever, hot flashes, night sweats, weight gain or weight loss Psychological ROS: negative for - anxiety, decreased libido, depression, mood swings, physical abuse or sexual abuse Ophthalmic ROS: negative for - blurry vision, eye pain or loss of vision ENT ROS: negative for - headaches, hearing change, visual changes or vocal changes Allergy and Immunology ROS: negative for - hives, itchy/watery eyes or seasonal allergies Hematological and Lymphatic ROS: negative for - bleeding problems, bruising, swollen lymph nodes or weight loss Endocrine ROS: negative for - galactorrhea, hair pattern changes, hot flashes, malaise/lethargy, mood swings, palpitations, polydipsia/polyuria, skin changes, temperature intolerance or unexpected weight changes Breast ROS: negative for - new or changing breast lumps or nipple discharge Respiratory ROS: negative for - cough or shortness of breath Cardiovascular ROS: negative for -  chest pain, irregular heartbeat, palpitations or shortness of breath Gastrointestinal ROS: no abdominal pain, change in bowel habits, or black or bloody stools Genito-Urinary ROS: no dysuria, trouble voiding, or hematuria Musculoskeletal ROS: negative for - joint pain or joint stiffness Neurological ROS: negative for - bowel and bladder control changes Dermatological ROS: negative for rash and skin lesion changes   Objective:   BP 128/79 mmHg  Pulse 62  Ht 5\' 4"  (1.626 m)  Wt 135 lb 4.8 oz (61.372 kg)  BMI 23.21 kg/m2  LMP 12/12/2015 (Exact Date) CONSTITUTIONAL: Well-developed, well-nourished female in no acute distress.  PSYCHIATRIC: Normal mood and affect. Normal behavior. Normal judgment and thought content. NEUROLGIC: Alert and oriented to person, place, and time. Normal muscle tone coordination. No cranial nerve deficit noted. HENT:  Normocephalic, atraumatic, External right and left ear normal. Oropharynx is clear and moist EYES: Conjunctivae and EOM are normal. Pupils are equal, round, and reactive to light. No scleral icterus.  NECK: Normal range of motion, supple, no masses.  Normal thyroid.  SKIN: Skin is warm and dry. No rash noted. Not diaphoretic. No erythema. No pallor. CARDIOVASCULAR: Normal heart rate noted, regular rhythm, no murmur. RESPIRATORY: Clear to auscultation bilaterally. Effort and breath sounds normal, no problems with respiration noted. BREASTS: Symmetric in size. No masses, skin changes, nipple drainage, or lymphadenopathy. ABDOMEN: Soft, normal bowel sounds, no distention noted.  No tenderness, rebound or guarding. No significant diastases. BLADDER: Normal PELVIC:  External Genitalia: Normal  BUS: Normal  Vagina: Normal; moderate mucus in vaginal vault  Cervix: Normal; no lesions  Uterus: Normal; midplane, normal size and shape, mobile, nontender  Adnexa: Normal  RV: External Exam NormaI and No Rectal Masses  MUSCULOSKELETAL: Normal range of motion.  No tenderness.  No cyanosis, clubbing, or edema.  2+ distal pulses. LYMPHATIC: No Axillary, Supraclavicular, or Inguinal Adenopathy.    Assessment:   Annual gynecologic examination 35 y.o. Contraception: vasectomy Normal BMI   Plan:  Pap: Not needed Mammogram: Not Indicated Stool Guaiac Testing:  Not Indicated Labs: thru pcp Routine preventative health maintenance measures emphasized: Exercise/Diet/Weight control, Tobacco Warnings and Alcohol/Substance use risks Return to Clinic - 1 Year   Crystal SneadsMiller, CMA  Herold HarmsMartin A Adah Stoneberg, MD  Note: This dictation was prepared with Dragon dictation along with smaller phrase technology. Any transcriptional errors that result from this process are unintentional.

## 2015-12-22 NOTE — Patient Instructions (Signed)
1. No Pap smear is done this year 2. Continue with self breast exam monthly 3. Continue with healthy eating and exercise 4. Contraception: Vasectomy 5. Return in 1 year for physical 6. Screening labs deferred to primary care appointment

## 2016-03-20 ENCOUNTER — Other Ambulatory Visit: Payer: Self-pay | Admitting: Family Medicine

## 2016-03-20 DIAGNOSIS — Z13220 Encounter for screening for lipoid disorders: Secondary | ICD-10-CM

## 2016-03-20 DIAGNOSIS — Z131 Encounter for screening for diabetes mellitus: Secondary | ICD-10-CM

## 2016-03-22 ENCOUNTER — Other Ambulatory Visit (INDEPENDENT_AMBULATORY_CARE_PROVIDER_SITE_OTHER): Payer: BLUE CROSS/BLUE SHIELD

## 2016-03-22 DIAGNOSIS — Z13220 Encounter for screening for lipoid disorders: Secondary | ICD-10-CM | POA: Diagnosis not present

## 2016-03-22 DIAGNOSIS — Z131 Encounter for screening for diabetes mellitus: Secondary | ICD-10-CM

## 2016-03-22 LAB — BASIC METABOLIC PANEL
BUN: 13 mg/dL (ref 6–23)
CHLORIDE: 102 meq/L (ref 96–112)
CO2: 29 meq/L (ref 19–32)
CREATININE: 0.7 mg/dL (ref 0.40–1.20)
Calcium: 9.2 mg/dL (ref 8.4–10.5)
GFR: 100.28 mL/min (ref >60.00)
Glucose, Bld: 84 mg/dL (ref 70–99)
POTASSIUM: 4.2 meq/L (ref 3.5–5.1)
Sodium: 137 mEq/L (ref 135–145)

## 2016-03-22 LAB — LIPID PANEL
CHOLESTEROL: 156 mg/dL (ref 0–200)
HDL: 67.4 mg/dL (ref >39.00)
LDL Cholesterol: 78 mg/dL (ref 0–99)
NonHDL: 88.83
TRIGLYCERIDES: 55 mg/dL (ref 0.0–149.0)
Total CHOL/HDL Ratio: 2
VLDL: 11 mg/dL (ref 0.0–40.0)

## 2016-03-27 ENCOUNTER — Encounter: Payer: Self-pay | Admitting: Family Medicine

## 2016-03-27 ENCOUNTER — Ambulatory Visit (INDEPENDENT_AMBULATORY_CARE_PROVIDER_SITE_OTHER): Payer: BLUE CROSS/BLUE SHIELD | Admitting: Family Medicine

## 2016-03-27 VITALS — BP 118/78 | HR 60 | Temp 98.1°F | Ht 66.0 in | Wt 136.5 lb

## 2016-03-27 DIAGNOSIS — H9201 Otalgia, right ear: Secondary | ICD-10-CM | POA: Diagnosis not present

## 2016-03-27 DIAGNOSIS — Z0000 Encounter for general adult medical examination without abnormal findings: Secondary | ICD-10-CM | POA: Diagnosis not present

## 2016-03-27 DIAGNOSIS — M26609 Unspecified temporomandibular joint disorder, unspecified side: Secondary | ICD-10-CM | POA: Insufficient documentation

## 2016-03-27 NOTE — Progress Notes (Signed)
Pre visit review using our clinic review tool, if applicable. No additional management support is needed unless otherwise documented below in the visit note. 

## 2016-03-27 NOTE — Assessment & Plan Note (Signed)
TMs WNL today. ETD mildly improving on flonase vs possible R TMJ dysfunction - discussed TMJ eccentric exercises. Update if no improvement noted.

## 2016-03-27 NOTE — Assessment & Plan Note (Signed)
Preventative protocols reviewed and updated unless pt declined. Discussed healthy diet and lifestyle.  

## 2016-03-27 NOTE — Patient Instructions (Signed)
You are doing well today Try TMJ exercises. Continue flonase. Return as needed or in 1 year for next physical.  Health Maintenance, Female Adopting a healthy lifestyle and getting preventive care can go a long way to promote health and wellness. Talk with your health care provider about what schedule of regular examinations is right for you. This is a good chance for you to check in with your provider about disease prevention and staying healthy. In between checkups, there are plenty of things you can do on your own. Experts have done a lot of research about which lifestyle changes and preventive measures are most likely to keep you healthy. Ask your health care provider for more information. WEIGHT AND DIET  Eat a healthy diet  Be sure to include plenty of vegetables, fruits, low-fat dairy products, and lean protein.  Do not eat a lot of foods high in solid fats, added sugars, or salt.  Get regular exercise. This is one of the most important things you can do for your health.  Most adults should exercise for at least 150 minutes each week. The exercise should increase your heart rate and make you sweat (moderate-intensity exercise).  Most adults should also do strengthening exercises at least twice a week. This is in addition to the moderate-intensity exercise.  Maintain a healthy weight  Body mass index (BMI) is a measurement that can be used to identify possible weight problems. It estimates body fat based on height and weight. Your health care provider can help determine your BMI and help you achieve or maintain a healthy weight.  For females 52 years of age and older:   A BMI below 18.5 is considered underweight.  A BMI of 18.5 to 24.9 is normal.  A BMI of 25 to 29.9 is considered overweight.  A BMI of 30 and above is considered obese.  Watch levels of cholesterol and blood lipids  You should start having your blood tested for lipids and cholesterol at 36 years of age, then  have this test every 5 years.  You may need to have your cholesterol levels checked more often if:  Your lipid or cholesterol levels are high.  You are older than 36 years of age.  You are at high risk for heart disease.  CANCER SCREENING   Lung Cancer  Lung cancer screening is recommended for adults 46-25 years old who are at high risk for lung cancer because of a history of smoking.  A yearly low-dose CT scan of the lungs is recommended for people who:  Currently smoke.  Have quit within the past 15 years.  Have at least a 30-pack-year history of smoking. A pack year is smoking an average of one pack of cigarettes a day for 1 year.  Yearly screening should continue until it has been 15 years since you quit.  Yearly screening should stop if you develop a health problem that would prevent you from having lung cancer treatment.  Breast Cancer  Practice breast self-awareness. This means understanding how your breasts normally appear and feel.  It also means doing regular breast self-exams. Let your health care provider know about any changes, no matter how small.  If you are in your 20s or 30s, you should have a clinical breast exam (CBE) by a health care provider every 1-3 years as part of a regular health exam.  If you are 76 or older, have a CBE every year. Also consider having a breast X-ray (mammogram) every year.  If you have a family history of breast cancer, talk to your health care provider about genetic screening.  If you are at high risk for breast cancer, talk to your health care provider about having an MRI and a mammogram every year.  Breast cancer gene (BRCA) assessment is recommended for women who have family members with BRCA-related cancers. BRCA-related cancers include:  Breast.  Ovarian.  Tubal.  Peritoneal cancers.  Results of the assessment will determine the need for genetic counseling and BRCA1 and BRCA2 testing. Cervical Cancer Your health  care provider may recommend that you be screened regularly for cancer of the pelvic organs (ovaries, uterus, and vagina). This screening involves a pelvic examination, including checking for microscopic changes to the surface of your cervix (Pap test). You may be encouraged to have this screening done every 3 years, beginning at age 21.  For women ages 30-65, health care providers may recommend pelvic exams and Pap testing every 3 years, or they may recommend the Pap and pelvic exam, combined with testing for human papilloma virus (HPV), every 5 years. Some types of HPV increase your risk of cervical cancer. Testing for HPV may also be done on women of any age with unclear Pap test results.  Other health care providers may not recommend any screening for nonpregnant women who are considered low risk for pelvic cancer and who do not have symptoms. Ask your health care provider if a screening pelvic exam is right for you.  If you have had past treatment for cervical cancer or a condition that could lead to cancer, you need Pap tests and screening for cancer for at least 20 years after your treatment. If Pap tests have been discontinued, your risk factors (such as having a new sexual partner) need to be reassessed to determine if screening should resume. Some women have medical problems that increase the chance of getting cervical cancer. In these cases, your health care provider may recommend more frequent screening and Pap tests. Colorectal Cancer  This type of cancer can be detected and often prevented.  Routine colorectal cancer screening usually begins at 36 years of age and continues through 36 years of age.  Your health care provider may recommend screening at an earlier age if you have risk factors for colon cancer.  Your health care provider may also recommend using home test kits to check for hidden blood in the stool.  A small camera at the end of a tube can be used to examine your colon  directly (sigmoidoscopy or colonoscopy). This is done to check for the earliest forms of colorectal cancer.  Routine screening usually begins at age 50.  Direct examination of the colon should be repeated every 5-10 years through 36 years of age. However, you may need to be screened more often if early forms of precancerous polyps or small growths are found. Skin Cancer  Check your skin from head to toe regularly.  Tell your health care provider about any new moles or changes in moles, especially if there is a change in a mole's shape or color.  Also tell your health care provider if you have a mole that is larger than the size of a pencil eraser.  Always use sunscreen. Apply sunscreen liberally and repeatedly throughout the day.  Protect yourself by wearing long sleeves, pants, a wide-brimmed hat, and sunglasses whenever you are outside. HEART DISEASE, DIABETES, AND HIGH BLOOD PRESSURE   High blood pressure causes heart disease and increases the risk   of stroke. High blood pressure is more likely to develop in:  People who have blood pressure in the high end of the normal range (130-139/85-89 mm Hg).  People who are overweight or obese.  People who are African American.  If you are 18-39 years of age, have your blood pressure checked every 3-5 years. If you are 40 years of age or older, have your blood pressure checked every year. You should have your blood pressure measured twice--once when you are at a hospital or clinic, and once when you are not at a hospital or clinic. Record the average of the two measurements. To check your blood pressure when you are not at a hospital or clinic, you can use:  An automated blood pressure machine at a pharmacy.  A home blood pressure monitor.  If you are between 55 years and 79 years old, ask your health care provider if you should take aspirin to prevent strokes.  Have regular diabetes screenings. This involves taking a blood sample to check  your fasting blood sugar level.  If you are at a normal weight and have a low risk for diabetes, have this test once every three years after 36 years of age.  If you are overweight and have a high risk for diabetes, consider being tested at a younger age or more often. PREVENTING INFECTION  Hepatitis B  If you have a higher risk for hepatitis B, you should be screened for this virus. You are considered at high risk for hepatitis B if:  You were born in a country where hepatitis B is common. Ask your health care provider which countries are considered high risk.  Your parents were born in a high-risk country, and you have not been immunized against hepatitis B (hepatitis B vaccine).  You have HIV or AIDS.  You use needles to inject street drugs.  You live with someone who has hepatitis B.  You have had sex with someone who has hepatitis B.  You get hemodialysis treatment.  You take certain medicines for conditions, including cancer, organ transplantation, and autoimmune conditions. Hepatitis C  Blood testing is recommended for:  Everyone born from 1945 through 1965.  Anyone with known risk factors for hepatitis C. Sexually transmitted infections (STIs)  You should be screened for sexually transmitted infections (STIs) including gonorrhea and chlamydia if:  You are sexually active and are younger than 36 years of age.  You are older than 36 years of age and your health care provider tells you that you are at risk for this type of infection.  Your sexual activity has changed since you were last screened and you are at an increased risk for chlamydia or gonorrhea. Ask your health care provider if you are at risk.  If you do not have HIV, but are at risk, it may be recommended that you take a prescription medicine daily to prevent HIV infection. This is called pre-exposure prophylaxis (PrEP). You are considered at risk if:  You are sexually active and do not regularly use  condoms or know the HIV status of your partner(s).  You take drugs by injection.  You are sexually active with a partner who has HIV. Talk with your health care provider about whether you are at high risk of being infected with HIV. If you choose to begin PrEP, you should first be tested for HIV. You should then be tested every 3 months for as long as you are taking PrEP.  PREGNANCY     If you are premenopausal and you may become pregnant, ask your health care provider about preconception counseling.  If you may become pregnant, take 400 to 800 micrograms (mcg) of folic acid every day.  If you want to prevent pregnancy, talk to your health care provider about birth control (contraception). OSTEOPOROSIS AND MENOPAUSE   Osteoporosis is a disease in which the bones lose minerals and strength with aging. This can result in serious bone fractures. Your risk for osteoporosis can be identified using a bone density scan.  If you are 65 years of age or older, or if you are at risk for osteoporosis and fractures, ask your health care provider if you should be screened.  Ask your health care provider whether you should take a calcium or vitamin D supplement to lower your risk for osteoporosis.  Menopause may have certain physical symptoms and risks.  Hormone replacement therapy may reduce some of these symptoms and risks. Talk to your health care provider about whether hormone replacement therapy is right for you.  HOME CARE INSTRUCTIONS   Schedule regular health, dental, and eye exams.  Stay current with your immunizations.   Do not use any tobacco products including cigarettes, chewing tobacco, or electronic cigarettes.  If you are pregnant, do not drink alcohol.  If you are breastfeeding, limit how much and how often you drink alcohol.  Limit alcohol intake to no more than 1 drink per day for nonpregnant women. One drink equals 12 ounces of beer, 5 ounces of wine, or 1 ounces of hard  liquor.  Do not use street drugs.  Do not share needles.  Ask your health care provider for help if you need support or information about quitting drugs.  Tell your health care provider if you often feel depressed.  Tell your health care provider if you have ever been abused or do not feel safe at home.   This information is not intended to replace advice given to you by your health care provider. Make sure you discuss any questions you have with your health care provider.   Document Released: 12/25/2010 Document Revised: 07/02/2014 Document Reviewed: 05/13/2013 Elsevier Interactive Patient Education 2016 Elsevier Inc.  

## 2016-03-27 NOTE — Progress Notes (Signed)
BP 118/78   Pulse 60   Temp 98.1 F (36.7 C) (Oral)   Ht 5\' 6"  (1.676 m)   Wt 136 lb 8 oz (61.9 kg)   LMP 03/09/2016   BMI 22.03 kg/m    CC: CPE Subjective:    Patient ID: Sherry Lara, female    DOB: 1980/08/05, 35 y.o.   MRN: [id number]  HPI: Sherry Lara is a 35 y.o. female presenting on 03/27/2016 for Annual Exam   Ongoing R earache. Saw ENT - started flonase which is helpful. ?worsening allergies. Saw dentist - teeth ok.  Preventative: Requests yearly for insurance purposes, will bring form [facility] - 11/2015 with Dr Avanell Shackleton [facility], normal paps. G4P3 Flu shot today Tetanus - 06/2014 Seat belt use discussed Sunscreen use discussed. No changing moles on skin Non smoker Alcohol - 2-4 drinks per week  "Sherry Lara"  Lives with husband and 3 children, 1 dog  Occupation: Horticulturist, commercial works at [employer]  Edu: [employer]  Activity: hot yoga, some running  Diet: good water, fruits/vegetables daily. One daughter is vegetarian   Relevant past medical, surgical, family and social history reviewed and updated as indicated. Interim medical history since our last visit reviewed. Allergies and medications reviewed and updated. Current Outpatient Prescriptions on File Prior to Visit  Medication Sig  . Loratadine (CLARITIN) 10 MG CAPS Take by mouth.   No current facility-administered medications on file prior to visit.     Review of Systems  Constitutional: Negative for activity change, appetite change, chills, fatigue, fever and unexpected weight change.  HENT: Positive for ear pain. Negative for hearing loss.   Eyes: Negative for visual disturbance.  Respiratory: Negative for cough, chest tightness, shortness of breath and wheezing.   Cardiovascular: Negative for chest pain, palpitations and leg swelling.  Gastrointestinal: Negative for abdominal distention, abdominal pain, blood in stool, constipation, diarrhea, nausea and vomiting.    Genitourinary: Negative for difficulty urinating and hematuria.  Musculoskeletal: Negative for arthralgias, myalgias and neck pain.  Skin: Negative for rash.  Neurological: Negative for dizziness, seizures, syncope and headaches.  Hematological: Negative for adenopathy. Does not bruise/bleed easily.  Psychiatric/Behavioral: Negative for dysphoric mood. The patient is not nervous/anxious.    Per HPI unless specifically indicated in ROS section     Objective:    BP 118/78   Pulse 60   Temp 98.1 F (36.7 C) (Oral)   Ht 5\' 6"  (1.676 m)   Wt 136 lb 8 oz (61.9 kg)   LMP 03/09/2016   BMI 22.03 kg/m   Wt Readings from Last 3 Encounters:  03/27/16 136 lb 8 oz (61.9 kg)  12/22/15 135 lb 4.8 oz (61.4 kg)  12/01/15 134 lb 12.8 oz (61.1 kg)    Physical Exam  Constitutional: She is oriented to person, place, and time. She appears well-developed and well-nourished. No distress.  HENT:  Head: Normocephalic and atraumatic.  Right Ear: Hearing, tympanic membrane, external ear and ear canal normal.  Left Ear: Hearing, tympanic membrane, external ear and ear canal normal.  Nose: Nose normal.  Mouth/Throat: Uvula is midline, oropharynx is clear and moist and mucous membranes are normal. No oropharyngeal exudate, posterior oropharyngeal edema or posterior oropharyngeal erythema.  Lack of full ROM noted at R TMJ without pain  Eyes: Conjunctivae and EOM are normal. Pupils are equal, round, and reactive to light. No scleral icterus.  Neck: Normal range of motion. Neck supple. No thyromegaly present.  Cardiovascular: Normal rate, regular rhythm, normal heart sounds  and intact distal pulses.   No murmur heard. Pulses:      Radial pulses are 2+ on the right side, and 2+ on the left side.  Pulmonary/Chest: Effort normal and breath sounds normal. No respiratory distress. She has no wheezes. She has no rales.  Abdominal: Soft. Bowel sounds are normal. She exhibits no distension and no mass. There is no  tenderness. There is no rebound and no guarding.  Musculoskeletal: Normal range of motion. She exhibits no edema.  Lymphadenopathy:    She has no cervical adenopathy.  Neurological: She is alert and oriented to person, place, and time.  CN grossly intact, station and gait intact  Skin: Skin is warm and dry. No rash noted.  Psychiatric: She has a normal mood and affect. Her behavior is normal. Judgment and thought content normal.  Nursing note and vitals reviewed.  Results for orders placed or performed in visit on 03/22/16  Lipid panel  Result Value Ref Range   Cholesterol 156 0 - 200 mg/dL   Triglycerides 62.155.0 0.0 - 149.0 mg/dL   HDL 30.8667.40 >57.84>39.00 mg/dL   VLDL 69.611.0 0.0 - 29.540.0 mg/dL   LDL Cholesterol 78 0 - 99 mg/dL   Total CHOL/HDL Ratio 2    NonHDL 88.83   Basic metabolic panel  Result Value Ref Range   Sodium 137 135 - 145 mEq/L   Potassium 4.2 3.5 - 5.1 mEq/L   Chloride 102 96 - 112 mEq/L   CO2 29 19 - 32 mEq/L   Glucose, Bld 84 70 - 99 mg/dL   BUN 13 6 - 23 mg/dL   Creatinine, Ser 2.840.70 0.40 - 1.20 mg/dL   Calcium 9.2 8.4 - 13.210.5 mg/dL   GFR 440.10100.28 >27.25>60.00 mL/min      Assessment & Plan:   Problem List Items Addressed This Visit    Earache on right    TMs WNL today. ETD mildly improving on flonase vs possible R TMJ dysfunction - discussed TMJ eccentric exercises. Update if no improvement noted.      Health maintenance examination - Primary    Preventative protocols reviewed and updated unless pt declined. Discussed healthy diet and lifestyle.        Other Visit Diagnoses   None.      Follow up plan: Return in about 1 year (around 03/27/2017), or as needed, for annual exam, prior fasting for blood work.  Eustaquio BoydenJavier Tilmon Wisehart, MD

## 2016-05-21 ENCOUNTER — Telehealth: Payer: Self-pay | Admitting: Family Medicine

## 2016-05-21 NOTE — Telephone Encounter (Signed)
Form is in your in box

## 2016-05-21 NOTE — Telephone Encounter (Signed)
I placed in Rx tower. °

## 2016-05-21 NOTE — Telephone Encounter (Signed)
Pt dropped off [employer] form to be filled out. Last cpe 03/27/16. Please fax to # on form and call when completed. Pt does not need form returned to her as long as faxed to ins.

## 2016-05-21 NOTE — Telephone Encounter (Signed)
Filled and in my outbox

## 2016-05-22 NOTE — Telephone Encounter (Signed)
Form faxed to [employer] and patient notified.

## 2016-12-25 NOTE — Progress Notes (Signed)
Patient ID: Sherry Lara, female   DOB: Oct 20, 1980, 36 y.o.   MRN: [id number] ANNUAL PREVENTATIVE CARE GYN  ENCOUNTER NOTE  Subjective:       Sherry Lara is a 36 y.o. [phone] female here for a routine annual gynecologic exam.  Current complaints: 1.  None  Menstrual cycles are regular and not painful or heavy. Patient reports no significant major interval health issues, new medications, or allergies. Bowel and bladder function are normal. 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   Gynecologic History No LMP recorded. Contraception: vasectomy Last Pap: 06/2013 neg/neg. Results were: normal Last mammogram: n/a. Results were: abnormal  Obstetric History OB History  Gravida Para Term Preterm AB Living  4 2 2   1 3   SAB TAB Ectopic Multiple Live Births  1       3    # Outcome Date GA Lbr Len/2nd Weight Sex Delivery Anes PTL Lv  4 SAB           3 Term      CS-LTranv   LIV  2 Term      CS-LTranv   LIV  1 Gravida      CS-LTranv   LIV      Past Medical History:  Diagnosis Date  . Anxiety   . Asthma   . Depression   . GDM (gestational diabetes mellitus)   . History of asthma    childhood  . History of chicken pox   . History of depression    and anxiety; prior on wellbutrin/celexa  . Hx of migraines    improved after pregnancies  . Migraines   . Vaginal irritation     Past Surgical History:  Procedure Laterality Date  . ADENOIDECTOMY    . CESAREAN SECTION  2008,2009,2013  . CHOLECYSTECTOMY  2009  . TONSILLECTOMY  [id number]  . WISDOM TOOTH EXTRACTION      Current Outpatient Prescriptions on File Prior to Visit  Medication Sig Dispense Refill  . fluticasone (FLONASE) 50 MCG/ACT nasal spray Place 2 sprays into both nostrils daily.    . Loratadine (CLARITIN) 10 MG CAPS Take by mouth.     No current facility-administered medications on file prior to  visit.     Allergies  Allergen Reactions  . Vicodin [Hydrocodone-Acetaminophen] Nausea And Vomiting  . Codeine Rash  . Penicillins Rash    Social History   Social History  . Marital status: Married    Spouse name: N/A  . Number of children: N/A  . Years of education: N/A   Occupational History  . Not on file.   Social History Main Topics  . Smoking status: Never Smoker  . Smokeless tobacco: Never Used  . Alcohol use 0.0 oz/week     Comment: occasional  . Drug use: No  . Sexual activity: Yes    Partners: Male     Comment: vasectomy   Other Topics Concern  . 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 One daughter is vegetarian    Family History  Problem Relation Age of Onset  . Hypertension Mother   . Hypertension Father   . Hyperlipidemia Father  possibly  . CAD Neg Hx   . Stroke Neg Hx   . Diabetes Neg Hx   . Cancer Neg Hx     The following portions of the patient's history were reviewed and updated as appropriate: allergies, current medications, past family history, past medical history, past social history, past surgical history and problem list.  Review of Systems Review of Systems  Constitutional: Negative.   HENT: Negative.   Eyes: Negative.   Respiratory: Negative.   Cardiovascular: Negative.   Genitourinary: Negative.   Musculoskeletal: Negative.   Skin: Negative.   Neurological: Negative.   Psychiatric/Behavioral: Negative.      Objective:   BP 105/71   Pulse 66   Ht 5\' 6"  (1.676 m)   Wt 143 lb 9.6 oz (65.1 kg)   LMP 12/19/2016 (Approximate)   BMI 23.18 kg/m  CONSTITUTIONAL: Well-developed, well-nourished female in no acute distress.  PSYCHIATRIC: Normal mood and affect. Normal behavior. Normal judgment and thought  content. NEUROLGIC: Alert and oriented to person, place, and time. Normal muscle tone coordination. No cranial nerve deficit noted. HENT:  Normocephalic, atraumatic, External right and left ear normal. Oropharynx is clear and moist EYES: Conjunctivae and EOM are normal. Pupils are equal, round, and reactive to light. No scleral icterus.  NECK: Normal range of motion, supple, no masses.  Normal thyroid.  SKIN: Skin is warm and dry. No rash noted. Not diaphoretic. No erythema. No pallor. CARDIOVASCULAR: Normal heart rate noted, regular rhythm, no murmur. RESPIRATORY: Clear to auscultation bilaterally. Effort and breath sounds normal, no problems with respiration noted. BREASTS: Symmetric in size. No masses, skin changes, nipple drainage, or lymphadenopathy. ABDOMEN: Soft, normal bowel sounds, no distention noted.  No tenderness, rebound or guarding. No significant diastases. BLADDER: Normal PELVIC:  External Genitalia: Normal  BUS: Normal  Vagina: Normal  Cervix: Normal; no lesions; cervix is deviated anteriorly  Uterus: Normal; retroverted, normal size and shape, mobile, mildly tender  Adnexa: Normal; nonpalpable nontender  RV: External Exam NormaI  MUSCULOSKELETAL: Normal range of motion. No tenderness.  No cyanosis, clubbing, or edema.  2+ distal pulses. LYMPHATIC: No Axillary, Supraclavicular, or Inguinal Adenopathy.    Assessment:   Annual gynecologic examination 36 y.o. Contraception: vasectomy Normal BMI   Plan:  Pap: pap/hpv Mammogram: Not Indicated Stool Guaiac Testing:  Not Indicated Labs: thru pcp Routine preventative health maintenance measures emphasized: Exercise/Diet/Weight control, Tobacco Warnings and Alcohol/Substance use risks Return to Clinic - 1 Year   Sherry Lara, CMA  Sherry HarmsMartin A Shuree Brossart, MD   Note: This dictation was prepared with Dragon dictation along with smaller phrase technology. Any transcriptional errors that result from this process are  unintentional.

## 2016-12-27 ENCOUNTER — Encounter: Payer: BLUE CROSS/BLUE SHIELD | Admitting: Obstetrics and Gynecology

## 2017-01-02 ENCOUNTER — Encounter: Payer: Self-pay | Admitting: Obstetrics and Gynecology

## 2017-01-02 ENCOUNTER — Ambulatory Visit (INDEPENDENT_AMBULATORY_CARE_PROVIDER_SITE_OTHER): Payer: BLUE CROSS/BLUE SHIELD | Admitting: Obstetrics and Gynecology

## 2017-01-02 VITALS — BP 105/71 | HR 66 | Ht 66.0 in | Wt 143.6 lb

## 2017-01-02 DIAGNOSIS — Z98891 History of uterine scar from previous surgery: Secondary | ICD-10-CM

## 2017-01-02 DIAGNOSIS — Z01419 Encounter for gynecological examination (general) (routine) without abnormal findings: Secondary | ICD-10-CM | POA: Diagnosis not present

## 2017-01-02 NOTE — Patient Instructions (Signed)
1. Pap smear is done 2. Self breast awareness is encouraged 3. Continue with healthy eating and exercise 4. Contraception-vasectomy 5. Screening labs are to be obtained through primary care  Health Maintenance, Female Adopting a healthy lifestyle and getting preventive care can go a long way to promote health and wellness. Talk with your health care provider about what schedule of regular examinations is right for you. This is a good chance for you to check in with your provider about disease prevention and staying healthy. In between checkups, there are plenty of things you can do on your own. Experts have done a lot of research about which lifestyle changes and preventive measures are most likely to keep you healthy. Ask your health care provider for more information. Weight and diet Eat a healthy diet  Be sure to include plenty of vegetables, fruits, low-fat dairy products, and lean protein.  Do not eat a lot of foods high in solid fats, added sugars, or salt.  Get regular exercise. This is one of the most important things you can do for your health. ? Most adults should exercise for at least 150 minutes each week. The exercise should increase your heart rate and make you sweat (moderate-intensity exercise). ? Most adults should also do strengthening exercises at least twice a week. This is in addition to the moderate-intensity exercise.  Maintain a healthy weight  Body mass index (BMI) is a measurement that can be used to identify possible weight problems. It estimates body fat based on height and weight. Your health care provider can help determine your BMI and help you achieve or maintain a healthy weight.  For females 58 years of age and older: ? A BMI below 18.5 is considered underweight. ? A BMI of 18.5 to 24.9 is normal. ? A BMI of 25 to 29.9 is considered overweight. ? A BMI of 30 and above is considered obese.  Watch levels of cholesterol and blood lipids  You should start  having your blood tested for lipids and cholesterol at 37 years of age, then have this test every 5 years.  You may need to have your cholesterol levels checked more often if: ? Your lipid or cholesterol levels are high. ? You are older than 37 years of age. ? You are at high risk for heart disease.  Cancer screening Lung Cancer  Lung cancer screening is recommended for adults 46-67 years old who are at high risk for lung cancer because of a history of smoking.  A yearly low-dose CT scan of the lungs is recommended for people who: ? Currently smoke. ? Have quit within the past 15 years. ? Have at least a 30-pack-year history of smoking. A pack year is smoking an average of one pack of cigarettes a day for 1 year.  Yearly screening should continue until it has been 15 years since you quit.  Yearly screening should stop if you develop a health problem that would prevent you from having lung cancer treatment.  Breast Cancer  Practice breast self-awareness. This means understanding how your breasts normally appear and feel.  It also means doing regular breast self-exams. Let your health care provider know about any changes, no matter how small.  If you are in your 20s or 30s, you should have a clinical breast exam (CBE) by a health care provider every 1-3 years as part of a regular health exam.  If you are 33 or older, have a CBE every year. Also consider having a  breast X-ray (mammogram) every year.  If you have a family history of breast cancer, talk to your health care provider about genetic screening.  If you are at high risk for breast cancer, talk to your health care provider about having an MRI and a mammogram every year.  Breast cancer gene (BRCA) assessment is recommended for women who have family members with BRCA-related cancers. BRCA-related cancers include: ? Breast. ? Ovarian. ? Tubal. ? Peritoneal cancers.  Results of the assessment will determine the need for  genetic counseling and BRCA1 and BRCA2 testing.  Cervical Cancer Your health care provider may recommend that you be screened regularly for cancer of the pelvic organs (ovaries, uterus, and vagina). This screening involves a pelvic examination, including checking for microscopic changes to the surface of your cervix (Pap test). You may be encouraged to have this screening done every 3 years, beginning at age 66.  For women ages 82-65, health care providers may recommend pelvic exams and Pap testing every 3 years, or they may recommend the Pap and pelvic exam, combined with testing for human papilloma virus (HPV), every 5 years. Some types of HPV increase your risk of cervical cancer. Testing for HPV may also be done on women of any age with unclear Pap test results.  Other health care providers may not recommend any screening for nonpregnant women who are considered low risk for pelvic cancer and who do not have symptoms. Ask your health care provider if a screening pelvic exam is right for you.  If you have had past treatment for cervical cancer or a condition that could lead to cancer, you need Pap tests and screening for cancer for at least 20 years after your treatment. If Pap tests have been discontinued, your risk factors (such as having a new sexual partner) need to be reassessed to determine if screening should resume. Some women have medical problems that increase the chance of getting cervical cancer. In these cases, your health care provider may recommend more frequent screening and Pap tests.  Colorectal Cancer  This type of cancer can be detected and often prevented.  Routine colorectal cancer screening usually begins at 37 years of age and continues through 37 years of age.  Your health care provider may recommend screening at an earlier age if you have risk factors for colon cancer.  Your health care provider may also recommend using home test kits to check for hidden blood in the  stool.  A small camera at the end of a tube can be used to examine your colon directly (sigmoidoscopy or colonoscopy). This is done to check for the earliest forms of colorectal cancer.  Routine screening usually begins at age 23.  Direct examination of the colon should be repeated every 5-10 years through 37 years of age. However, you may need to be screened more often if early forms of precancerous polyps or small growths are found.  Skin Cancer  Check your skin from head to toe regularly.  Tell your health care provider about any new moles or changes in moles, especially if there is a change in a mole's shape or color.  Also tell your health care provider if you have a mole that is larger than the size of a pencil eraser.  Always use sunscreen. Apply sunscreen liberally and repeatedly throughout the day.  Protect yourself by wearing long sleeves, pants, a wide-brimmed hat, and sunglasses whenever you are outside.  Heart disease, diabetes, and high blood pressure  High  blood pressure causes heart disease and increases the risk of stroke. High blood pressure is more likely to develop in: ? People who have blood pressure in the high end of the normal range (130-139/85-89 mm Hg). ? People who are overweight or obese. ? People who are African American.  If you are 43-71 years of age, have your blood pressure checked every 3-5 years. If you are 34 years of age or older, have your blood pressure checked every year. You should have your blood pressure measured twice-once when you are at a hospital or clinic, and once when you are not at a hospital or clinic. Record the average of the two measurements. To check your blood pressure when you are not at a hospital or clinic, you can use: ? An automated blood pressure machine at a pharmacy. ? A home blood pressure monitor.  If you are between 36 years and 71 years old, ask your health care provider if you should take aspirin to prevent  strokes.  Have regular diabetes screenings. This involves taking a blood sample to check your fasting blood sugar level. ? If you are at a normal weight and have a low risk for diabetes, have this test once every three years after 37 years of age. ? If you are overweight and have a high risk for diabetes, consider being tested at a younger age or more often. Preventing infection Hepatitis B  If you have a higher risk for hepatitis B, you should be screened for this virus. You are considered at high risk for hepatitis B if: ? You were born in a country where hepatitis B is common. Ask your health care provider which countries are considered high risk. ? Your parents were born in a high-risk country, and you have not been immunized against hepatitis B (hepatitis B vaccine). ? You have HIV or AIDS. ? You use needles to inject street drugs. ? You live with someone who has hepatitis B. ? You have had sex with someone who has hepatitis B. ? You get hemodialysis treatment. ? You take certain medicines for conditions, including cancer, organ transplantation, and autoimmune conditions.  Hepatitis C  Blood testing is recommended for: ? Everyone born from [birth year] through 1966. ? Anyone with known risk factors for hepatitis C.  Sexually transmitted infections (STIs)  You should be screened for sexually transmitted infections (STIs) including gonorrhea and chlamydia if: ? You are sexually active and are younger than 37 years of age. ? You are older than 37 years of age and your health care provider tells you that you are at risk for this type of infection. ? Your sexual activity has changed since you were last screened and you are at an increased risk for chlamydia or gonorrhea. Ask your health care provider if you are at risk.  If you do not have HIV, but are at risk, it may be recommended that you take a prescription medicine daily to prevent HIV infection. This is called pre-exposure prophylaxis  (PrEP). You are considered at risk if: ? You are sexually active and do not regularly use condoms or know the HIV status of your partner(s). ? You take drugs by injection. ? You are sexually active with a partner who has HIV.  Talk with your health care provider about whether you are at high risk of being infected with HIV. If you choose to begin PrEP, you should first be tested for HIV. You should then be tested every 3 months for  as long as you are taking PrEP. Pregnancy  If you are premenopausal and you may become pregnant, ask your health care provider about preconception counseling.  If you may become pregnant, take 400 to 800 micrograms (mcg) of folic acid every day.  If you want to prevent pregnancy, talk to your health care provider about birth control (contraception). Osteoporosis and menopause  Osteoporosis is a disease in which the bones lose minerals and strength with aging. This can result in serious bone fractures. Your risk for osteoporosis can be identified using a bone density scan.  If you are 58 years of age or older, or if you are at risk for osteoporosis and fractures, ask your health care provider if you should be screened.  Ask your health care provider whether you should take a calcium or vitamin D supplement to lower your risk for osteoporosis.  Menopause may have certain physical symptoms and risks.  Hormone replacement therapy may reduce some of these symptoms and risks. Talk to your health care provider about whether hormone replacement therapy is right for you. Follow these instructions at home:  Schedule regular health, dental, and eye exams.  Stay current with your immunizations.  Do not use any tobacco products including cigarettes, chewing tobacco, or electronic cigarettes.  If you are pregnant, do not drink alcohol.  If you are breastfeeding, limit how much and how often you drink alcohol.  Limit alcohol intake to no more than 1 drink per day for  nonpregnant women. One drink equals 12 ounces of beer, 5 ounces of wine, or 1 ounces of hard liquor.  Do not use street drugs.  Do not share needles.  Ask your health care provider for help if you need support or information about quitting drugs.  Tell your health care provider if you often feel depressed.  Tell your health care provider if you have ever been abused or do not feel safe at home. This information is not intended to replace advice given to you by your health care provider. Make sure you discuss any questions you have with your health care provider. Document Released: 12/25/2010 Document Revised: 11/17/2015 Document Reviewed: 03/15/2015 Elsevier Interactive Patient Education  [employer].

## 2017-01-04 LAB — PAP IG AND HPV HIGH-RISK
HPV, high-risk: NEGATIVE
PAP SMEAR COMMENT: 0

## 2017-03-24 ENCOUNTER — Other Ambulatory Visit: Payer: Self-pay | Admitting: Family Medicine

## 2017-03-24 DIAGNOSIS — Z0279 Encounter for issue of other medical certificate: Secondary | ICD-10-CM

## 2017-03-25 ENCOUNTER — Other Ambulatory Visit (INDEPENDENT_AMBULATORY_CARE_PROVIDER_SITE_OTHER): Payer: BLUE CROSS/BLUE SHIELD

## 2017-03-25 DIAGNOSIS — Z0279 Encounter for issue of other medical certificate: Secondary | ICD-10-CM | POA: Diagnosis not present

## 2017-03-25 LAB — LIPID PANEL
Cholesterol: 159 mg/dL (ref 0–200)
HDL: 60.3 mg/dL (ref >39.00)
LDL Cholesterol: 87 mg/dL (ref 0–99)
NONHDL: 98.2
TRIGLYCERIDES: 58 mg/dL (ref 0.0–149.0)
Total CHOL/HDL Ratio: 3
VLDL: 11.6 mg/dL (ref 0.0–40.0)

## 2017-03-25 LAB — BASIC METABOLIC PANEL
BUN: 9 mg/dL (ref 6–23)
CALCIUM: 9.2 mg/dL (ref 8.4–10.5)
CO2: 24 mEq/L (ref 19–32)
CREATININE: 0.64 mg/dL (ref 0.40–1.20)
Chloride: 102 mEq/L (ref 96–112)
GFR: 110.59 mL/min (ref >60.00)
GLUCOSE: 94 mg/dL (ref 70–99)
Potassium: 4.2 mEq/L (ref 3.5–5.1)
SODIUM: 135 meq/L (ref 135–145)

## 2017-03-29 ENCOUNTER — Encounter: Payer: Self-pay | Admitting: Family Medicine

## 2017-03-29 ENCOUNTER — Ambulatory Visit (INDEPENDENT_AMBULATORY_CARE_PROVIDER_SITE_OTHER): Payer: BLUE CROSS/BLUE SHIELD | Admitting: Family Medicine

## 2017-03-29 VITALS — BP 120/80 | HR 71 | Temp 98.5°F | Ht 65.0 in | Wt 143.5 lb

## 2017-03-29 DIAGNOSIS — Z0001 Encounter for general adult medical examination with abnormal findings: Secondary | ICD-10-CM

## 2017-03-29 DIAGNOSIS — F410 Panic disorder [episodic paroxysmal anxiety] without agoraphobia: Secondary | ICD-10-CM | POA: Diagnosis not present

## 2017-03-29 DIAGNOSIS — F331 Major depressive disorder, recurrent, moderate: Secondary | ICD-10-CM | POA: Diagnosis not present

## 2017-03-29 DIAGNOSIS — M26609 Unspecified temporomandibular joint disorder, unspecified side: Secondary | ICD-10-CM | POA: Diagnosis not present

## 2017-03-29 DIAGNOSIS — Z0000 Encounter for general adult medical examination without abnormal findings: Secondary | ICD-10-CM

## 2017-03-29 MED ORDER — LORAZEPAM 0.5 MG PO TABS: 0.5000 mg | ORAL_TABLET | Freq: Two times a day (BID) | ORAL | 0 refills | Status: DC | PRN

## 2017-03-29 MED ORDER — FLUOXETINE HCL 10 MG PO CAPS: 10.0000 mg | ORAL_CAPSULE | Freq: Every day | ORAL | 6 refills | Status: DC

## 2017-03-29 MED ORDER — FLUTICASONE PROPIONATE 50 MCG/ACT NA SUSP: 2.0000 | Freq: Every day | NASAL | 11 refills | Status: DC

## 2017-03-29 NOTE — Assessment & Plan Note (Signed)
Recurrent MDD over last 6 months. Reviewed treatment options and importance of ongoing stress relieving strategies. She already sees counselor and will continue this.  Will start prozac  daily (previously effective) with ativan PRN anxiety attack.  RTC 6-8 wks f/u visit.

## 2017-03-29 NOTE — Assessment & Plan Note (Signed)
Preventative protocols reviewed and updated unless pt declined. Discussed healthy diet and lifestyle.  

## 2017-03-29 NOTE — Assessment & Plan Note (Signed)
Not improved with eccentric TMJ exercises.  She already regularly uses mouthguard.  Discussed possible flexeril use - will hold for now given we're already starting possibly sedating medication.

## 2017-03-29 NOTE — Patient Instructions (Addendum)
Return for labs at your convenience. Let's start prozac '10mg'$  daily - sent to pharmacy. May use ativan 0.'5mg'$  1/2-1 tablet as needed for anxiety for next few months.  Continue healthy stress relieving strategies.  Return in 6-8 weeks for follow up visit. Let us know sooner if not improving as expected.  Health Maintenance, Female Adopting a healthy lifestyle and getting preventive care can go a long way to promote health and wellness. Talk with your health care provider about what schedule of regular examinations is right for you. This is a good chance for you to check in with your provider about disease prevention and staying healthy. In between checkups, there are plenty of things you can do on your own. Experts have done a lot of research about which lifestyle changes and preventive measures are most likely to keep you healthy. Ask your health care provider for more information. Weight and diet Eat a healthy diet  Be sure to include plenty of vegetables, fruits, low-fat dairy products, and lean protein.  Do not eat a lot of foods high in solid fats, added sugars, or salt.  Get regular exercise. This is one of the most important things you can do for your health. ? Most adults should exercise for at least 150 minutes each week. The exercise should increase your heart rate and make you sweat (moderate-intensity exercise). ? Most adults should also do strengthening exercises at least twice a week. This is in addition to the moderate-intensity exercise.  Maintain a healthy weight  Body mass index (BMI) is a measurement that can be used to identify possible weight problems. It estimates body fat based on height and weight. Your health care provider can help determine your BMI and help you achieve or maintain a healthy weight.  For females 100 years of age and older: ? A BMI below 18.5 is considered underweight. ? A BMI of 18.5 to 24.9 is normal. ? A BMI of 25 to 29.9 is considered  overweight. ? A BMI of 30 and above is considered obese.  Watch levels of cholesterol and blood lipids  You should start having your blood tested for lipids and cholesterol at 37 years of age, then have this test every 5 years.  You may need to have your cholesterol levels checked more often if: ? Your lipid or cholesterol levels are high. ? You are older than 37 years of age. ? You are at high risk for heart disease.  Cancer screening Lung Cancer  Lung cancer screening is recommended for adults 54-62 years old who are at high risk for lung cancer because of a history of smoking.  A yearly low-dose CT scan of the lungs is recommended for people who: ? Currently smoke. ? Have quit within the past 15 years. ? Have at least a 30-pack-year history of smoking. A pack year is smoking an average of one pack of cigarettes a day for 1 year.  Yearly screening should continue until it has been 15 years since you quit.  Yearly screening should stop if you develop a health problem that would prevent you from having lung cancer treatment.  Breast Cancer  Practice breast self-awareness. This means understanding how your breasts normally appear and feel.  It also means doing regular breast self-exams. Let your health care provider know about any changes, no matter how small.  If you are in your 20s or 30s, you should have a clinical breast exam (CBE) by a health care provider every 1-3 years  as part of a regular health exam.  If you are 54 or older, have a CBE every year. Also consider having a breast X-ray (mammogram) every year.  If you have a family history of breast cancer, talk to your health care provider about genetic screening.  If you are at high risk for breast cancer, talk to your health care provider about having an MRI and a mammogram every year.  Breast cancer gene (BRCA) assessment is recommended for women who have family members with BRCA-related cancers. BRCA-related cancers  include: ? Breast. ? Ovarian. ? Tubal. ? Peritoneal cancers.  Results of the assessment will determine the need for genetic counseling and BRCA1 and BRCA2 testing.  Cervical Cancer Your health care provider may recommend that you be screened regularly for cancer of the pelvic organs (ovaries, uterus, and vagina). This screening involves a pelvic examination, including checking for microscopic changes to the surface of your cervix (Pap test). You may be encouraged to have this screening done every 3 years, beginning at age 69.  For women ages 40-65, health care providers may recommend pelvic exams and Pap testing every 3 years, or they may recommend the Pap and pelvic exam, combined with testing for human papilloma virus (HPV), every 5 years. Some types of HPV increase your risk of cervical cancer. Testing for HPV may also be done on women of any age with unclear Pap test results.  Other health care providers may not recommend any screening for nonpregnant women who are considered low risk for pelvic cancer and who do not have symptoms. Ask your health care provider if a screening pelvic exam is right for you.  If you have had past treatment for cervical cancer or a condition that could lead to cancer, you need Pap tests and screening for cancer for at least 20 years after your treatment. If Pap tests have been discontinued, your risk factors (such as having a new sexual partner) need to be reassessed to determine if screening should resume. Some women have medical problems that increase the chance of getting cervical cancer. In these cases, your health care provider may recommend more frequent screening and Pap tests.  Colorectal Cancer  This type of cancer can be detected and often prevented.  Routine colorectal cancer screening usually begins at 37 years of age and continues through 37 years of age.  Your health care provider may recommend screening at an earlier age if you have risk factors  for colon cancer.  Your health care provider may also recommend using home test kits to check for hidden blood in the stool.  A small camera at the end of a tube can be used to examine your colon directly (sigmoidoscopy or colonoscopy). This is done to check for the earliest forms of colorectal cancer.  Routine screening usually begins at age 60.  Direct examination of the colon should be repeated every 5-10 years through 37 years of age. However, you may need to be screened more often if early forms of precancerous polyps or small growths are found.  Skin Cancer  Check your skin from head to toe regularly.  Tell your health care provider about any new moles or changes in moles, especially if there is a change in a mole's shape or color.  Also tell your health care provider if you have a mole that is larger than the size of a pencil eraser.  Always use sunscreen. Apply sunscreen liberally and repeatedly throughout the day.  Protect yourself by  wearing long sleeves, pants, a wide-brimmed hat, and sunglasses whenever you are outside.  Heart disease, diabetes, and high blood pressure  High blood pressure causes heart disease and increases the risk of stroke. High blood pressure is more likely to develop in: ? People who have blood pressure in the high end of the normal range (130-139/85-89 mm Hg). ? People who are overweight or obese. ? People who are African American.  If you are 48-51 years of age, have your blood pressure checked every 3-5 years. If you are 17 years of age or older, have your blood pressure checked every year. You should have your blood pressure measured twice-once when you are at a hospital or clinic, and once when you are not at a hospital or clinic. Record the average of the two measurements. To check your blood pressure when you are not at a hospital or clinic, you can use: ? An automated blood pressure machine at a pharmacy. ? A home blood pressure monitor.  If  you are between 67 years and 58 years old, ask your health care provider if you should take aspirin to prevent strokes.  Have regular diabetes screenings. This involves taking a blood sample to check your fasting blood sugar level. ? If you are at a normal weight and have a low risk for diabetes, have this test once every three years after 37 years of age. ? If you are overweight and have a high risk for diabetes, consider being tested at a younger age or more often. Preventing infection Hepatitis B  If you have a higher risk for hepatitis B, you should be screened for this virus. You are considered at high risk for hepatitis B if: ? You were born in a country where hepatitis B is common. Ask your health care provider which countries are considered high risk. ? Your parents were born in a high-risk country, and you have not been immunized against hepatitis B (hepatitis B vaccine). ? You have HIV or AIDS. ? You use needles to inject street drugs. ? You live with someone who has hepatitis B. ? You have had sex with someone who has hepatitis B. ? You get hemodialysis treatment. ? You take certain medicines for conditions, including cancer, organ transplantation, and autoimmune conditions.  Hepatitis C  Blood testing is recommended for: ? Everyone born from [birth year] through 1966. ? Anyone with known risk factors for hepatitis C.  Sexually transmitted infections (STIs)  You should be screened for sexually transmitted infections (STIs) including gonorrhea and chlamydia if: ? You are sexually active and are younger than 37 years of age. ? You are older than 37 years of age and your health care provider tells you that you are at risk for this type of infection. ? Your sexual activity has changed since you were last screened and you are at an increased risk for chlamydia or gonorrhea. Ask your health care provider if you are at risk.  If you do not have HIV, but are at risk, it may be recommended  that you take a prescription medicine daily to prevent HIV infection. This is called pre-exposure prophylaxis (PrEP). You are considered at risk if: ? You are sexually active and do not regularly use condoms or know the HIV status of your partner(s). ? You take drugs by injection. ? You are sexually active with a partner who has HIV.  Talk with your health care provider about whether you are at high risk of being infected with  HIV. If you choose to begin PrEP, you should first be tested for HIV. You should then be tested every 3 months for as long as you are taking PrEP. Pregnancy  If you are premenopausal and you may become pregnant, ask your health care provider about preconception counseling.  If you may become pregnant, take 400 to 800 micrograms (mcg) of folic acid every day.  If you want to prevent pregnancy, talk to your health care provider about birth control (contraception). Osteoporosis and menopause  Osteoporosis is a disease in which the bones lose minerals and strength with aging. This can result in serious bone fractures. Your risk for osteoporosis can be identified using a bone density scan.  If you are 21 years of age or older, or if you are at risk for osteoporosis and fractures, ask your health care provider if you should be screened.  Ask your health care provider whether you should take a calcium or vitamin D supplement to lower your risk for osteoporosis.  Menopause may have certain physical symptoms and risks.  Hormone replacement therapy may reduce some of these symptoms and risks. Talk to your health care provider about whether hormone replacement therapy is right for you. Follow these instructions at home:  Schedule regular health, dental, and eye exams.  Stay current with your immunizations.  Do not use any tobacco products including cigarettes, chewing tobacco, or electronic cigarettes.  If you are pregnant, do not drink alcohol.  If you are  breastfeeding, limit how much and how often you drink alcohol.  Limit alcohol intake to no more than 1 drink per day for nonpregnant women. One drink equals 12 ounces of beer, 5 ounces of wine, or 1 ounces of hard liquor.  Do not use street drugs.  Do not share needles.  Ask your health care provider for help if you need support or information about quitting drugs.  Tell your health care provider if you often feel depressed.  Tell your health care provider if you have ever been abused or do not feel safe at home. This information is not intended to replace advice given to you by your health care provider. Make sure you discuss any questions you have with your health care provider. Document Released: 12/25/2010 Document Revised: 11/17/2015 Document Reviewed: 03/15/2015 Elsevier Interactive Patient Education  [employer].

## 2017-03-29 NOTE — Progress Notes (Signed)
BP 120/80 (BP Location: Left Arm, Patient Position: Sitting, Cuff Size: Normal)   Pulse 71   Temp 98.5 F (36.9 C) (Oral)   Ht  (1.651 m)   Wt 143 lb 8 oz (65.1 kg)   LMP 03/09/2017   SpO2 97%   BMI 23.88 kg/m    CC: CPE Subjective:    Patient ID: Sherry Lara, female    DOB: 04/16/1981, 36 y.o.   MRN: [id number]  HPI: Sherry Lara is a 36 y.o. female presenting on 03/29/2017 for Annual Exam (Trouble with mood. Feels anxious and sad)   6 mo h/o trouble with mood - anxious, sad, tired, fatigued, low energy, trouble concentrating. Restless. + depressed mood. No significant guilt. Appetite ok. Sleep ok. Some anhedonia. Some anxiety attack symptoms - dyspnea, heart racing, headaches.  Denies SI/HI. No down time.  Trying to eat healthier. Yoga, meditation not helping.  At times feels overwhelmed - works 2 jobs, has 3 children.  She started seeing therapist in [place] recently.   Has previously taken medication - prozac, celexa (didn't like this), wellbutrin.   Ongoing TMJ tightness despite regular mouthguard use.  H/o migraines.   Preventative: Requests yearly for insurance purposes, will bring form  [facility] - yearly with Dr Greggory [facility], normal paps. G4P3 Flu shot yearly Tetanus - 06/2014 Seat belt use discussed Sunscreen use discussed. No changing moles on skin. Sees derm. Non smoker Alcohol - rare  "[name]"  Lives with husband and 3 children, 1 dog  Occupation: Horticulturist, commercial works at [employer]  Edu: [employer]  Activity: hot yoga, some running  Diet: good water, fruits/vegetables daily. One daughter is vegetarian   Relevant past medical, surgical, family and social history reviewed and updated as indicated. Interim medical history since our last visit reviewed. Allergies and medications reviewed and updated. Outpatient Medications Prior to Visit  Medication Sig Dispense Refill  . fluticasone (FLONASE) 50 MCG/ACT  nasal spray Place 2 sprays into both nostrils daily.    . Loratadine (CLARITIN) 10 MG CAPS Take by mouth.    . spironolactone (ALDACTONE) 50 MG tablet      No facility-administered medications prior to visit.      Per HPI unless specifically indicated in ROS section below Review of Systems  Constitutional: Negative for activity change, appetite change, chills, fatigue, fever and unexpected weight change.  HENT: Negative for hearing loss.   Eyes: Negative for visual disturbance.  Respiratory: Positive for shortness of breath (with anxiety). Negative for cough, chest tightness and wheezing.   Cardiovascular: Negative for chest pain, palpitations and leg swelling.  Gastrointestinal: Negative for abdominal distention, abdominal pain, blood in stool, constipation, diarrhea, nausea and vomiting.  Genitourinary: Negative for difficulty urinating and hematuria.  Musculoskeletal: Negative for arthralgias, myalgias and neck pain.  Skin: Negative for rash.  Neurological: Positive for headaches. Negative for dizziness, seizures and syncope.       Ongoing TMJ tightness bilaterally  Hematological: Negative for adenopathy. Does not bruise/bleed easily.  Psychiatric/Behavioral: Positive for dysphoric mood. The patient is nervous/anxious.        Objective:    BP 120/80 (BP Location: Left Arm, Patient Position: Sitting, Cuff Size: Normal)   Pulse 71   Temp 98.5 F (36.9 C) (Oral)   Ht  (1.651 m)   Wt 143 lb 8 oz (65.1 kg)   LMP 03/09/2017   SpO2 97%   BMI 23.88 kg/m   Wt Readings from Last 3 Encounters:  03/29/17 143  lb 8 oz (65.1 kg)  01/02/17 143 lb 9.6 oz (65.1 kg)  03/27/16 136 lb 8 oz (61.9 kg)    Physical Exam  Constitutional: She is oriented to person, place, and time. She appears well-developed and well-nourished. No distress.  HENT:  Head: Normocephalic and atraumatic.  Right Ear: Hearing, tympanic membrane, external ear and ear canal normal.  Left Ear: Hearing, tympanic  membrane, external ear and ear canal normal.  Nose: Nose normal.  Mouth/Throat: Uvula is midline, oropharynx is clear and moist and mucous membranes are normal. No oropharyngeal exudate, posterior oropharyngeal edema or posterior oropharyngeal erythema.  Eyes: Pupils are equal, round, and reactive to light. Conjunctivae and EOM are normal. No scleral icterus.  Neck: Normal range of motion. Neck supple. No thyromegaly present.  Cardiovascular: Normal rate, regular rhythm, normal heart sounds and intact distal pulses.   No murmur heard. Pulses:      Radial pulses are 2+ on the right side, and 2+ on the left side.  Pulmonary/Chest: Effort normal and breath sounds normal. No respiratory distress. She has no wheezes. She has no rales.  Abdominal: Soft. Bowel sounds are normal. She exhibits no distension and no mass. There is no tenderness. There is no rebound and no guarding.  Musculoskeletal: Normal range of motion. She exhibits no edema.  Lymphadenopathy:    She has no cervical adenopathy.  Neurological: She is alert and oriented to person, place, and time.  CN grossly intact, station and gait intact  Skin: Skin is warm and dry. No rash noted.  Psychiatric: She has a normal mood and affect. Her behavior is normal. Judgment and thought content normal.  Nursing note and vitals reviewed.  Results for orders placed or performed in visit on 03/25/17  Basic metabolic panel  Result Value Ref Range   Sodium 135 135 - 145 mEq/L   Potassium 4.2 3.5 - 5.1 mEq/L   Chloride 102 96 - 112 mEq/L   CO2 24 19 - 32 mEq/L   Glucose, Bld 94 70 - 99 mg/dL   BUN 9 6 - 23 mg/dL   Creatinine, Ser 1.61 0.40 - 1.20 mg/dL   Calcium 9.2 8.4 - 09.6 mg/dL   GFR 045.40 >98.11 mL/min  Lipid panel  Result Value Ref Range   Cholesterol 159 0 - 200 mg/dL   Triglycerides 91.4 0.0 - 149.0 mg/dL   HDL 78.29 >56.21 mg/dL   VLDL 30.8 0.0 - 65.7 mg/dL   LDL Cholesterol 87 0 - 99 mg/dL   Total CHOL/HDL Ratio 3    NonHDL  98.20       Assessment & Plan:   Problem List Items Addressed This Visit    Anxiety attack   Relevant Medications   FLUoxetine (PROZAC) 10 MG capsule   LORazepam (ATIVAN) 0.5 MG tablet   Other Relevant Orders   TSH   Health maintenance examination - Primary    Preventative protocols reviewed and updated unless pt declined. Discussed healthy diet and lifestyle.       Moderate episode of recurrent major depressive disorder (HCC)    Recurrent MDD over last 6 months. Reviewed treatment options and importance of ongoing stress relieving strategies. She already sees counselor and will continue this.  Will start prozac  daily (previously effective) with ativan PRN anxiety attack.  RTC 6-8 wks f/u visit.       Relevant Medications   FLUoxetine (PROZAC) 10 MG capsule   LORazepam (ATIVAN) 0.5 MG tablet   TMJ dysfunction  Not improved with eccentric TMJ exercises.  She already regularly uses mouthguard.  Discussed possible flexeril use - will hold for now given we're already starting possibly sedating medication.          Follow up plan: Return in about 8 weeks (around 05/24/2017) for follow up visit.  Eustaquio Boyden, MD

## 2017-04-19 ENCOUNTER — Other Ambulatory Visit (INDEPENDENT_AMBULATORY_CARE_PROVIDER_SITE_OTHER): Payer: BLUE CROSS/BLUE SHIELD

## 2017-04-19 DIAGNOSIS — F410 Panic disorder [episodic paroxysmal anxiety] without agoraphobia: Secondary | ICD-10-CM | POA: Diagnosis not present

## 2017-04-19 LAB — TSH: TSH: 1.03 u[IU]/mL (ref 0.35–4.50)

## 2017-05-24 ENCOUNTER — Encounter: Payer: Self-pay | Admitting: Family Medicine

## 2017-05-24 ENCOUNTER — Ambulatory Visit (INDEPENDENT_AMBULATORY_CARE_PROVIDER_SITE_OTHER): Payer: BLUE CROSS/BLUE SHIELD | Admitting: Family Medicine

## 2017-05-24 VITALS — BP 118/70 | HR 62 | Temp 98.1°F | Wt 150.0 lb

## 2017-05-24 DIAGNOSIS — F331 Major depressive disorder, recurrent, moderate: Secondary | ICD-10-CM | POA: Diagnosis not present

## 2017-05-24 MED ORDER — FLUOXETINE HCL 10 MG PO CAPS: 10.0000 mg | ORAL_CAPSULE | Freq: Every day | ORAL | 1 refills | Status: DC

## 2017-05-24 NOTE — Assessment & Plan Note (Signed)
Overall doing better. Will continue current prozac dose. Discussed ativan use. Update us over MyChart in 3 months. Pt agrees with plan. Will send prozac to mail order pharmacy.  PHQ9 = 7 GAD7 = 4

## 2017-05-24 NOTE — Progress Notes (Signed)
   BP 118/70 (BP Location: Left Arm, Patient Position: Sitting, Cuff Size: Normal)   Pulse 62   Temp 98.1 F (36.7 C) (Oral)   Wt 150 lb (68 kg)   LMP 05/24/2017   SpO2 100%   BMI 24.96 kg/m    CC: 2 mo f/u visit  Subjective:    Patient ID: Sherry Lara, female    DOB: 07/16/1980, 36 y.o.   MRN: [id number]  HPI: Sherry Lara is a 36 y.o. female presenting on 05/24/2017 for Depression (follow-up. Needs fluoxetine rx sent to [facility])   See prior note for details. See here last month with worsening mood - thought recurrent MDD with anxiety attack. Started on prozac 10mg  daily with ativan PRN anxiety attack.  Sees therapist in [place].   Last 2 months have been better. She has not taken ativan - worried about side effects. Concentration improved, less irritable, overall happier.  Still lacks motivation for exercise regimen.  Relevant past medical, surgical, family and social history reviewed and updated as indicated. Interim medical history since our last visit reviewed. Allergies and medications reviewed and updated. Outpatient Medications Prior to Visit  Medication Sig Dispense Refill  . fluticasone (FLONASE) 50 MCG/ACT nasal spray Place 2 sprays into both nostrils daily. 16 g 11  . LORazepam (ATIVAN) 0.5 MG tablet Take 1 tablet (0.5 mg total) by mouth 2 (two) times daily as needed for anxiety. 30 tablet 0  . FLUoxetine (PROZAC) 10 MG capsule Take 1 capsule (10 mg total) by mouth daily. 30 capsule 6   No facility-administered medications prior to visit.      Per HPI unless specifically indicated in ROS section below Review of Systems     Objective:    BP 118/70 (BP Location: Left Arm, Patient Position: Sitting, Cuff Size: Normal)   Pulse 62   Temp 98.1 F (36.7 C) (Oral)   Wt 150 lb (68 kg)   LMP 05/24/2017   SpO2 100%   BMI 24.96 kg/m   Wt Readings from Last 3 Encounters:  05/24/17 150 lb (68 kg)  03/29/17 143 lb 8 oz (65.1 kg)    01/02/17 143 lb 9.6 oz (65.1 kg)    Physical Exam  Constitutional: She appears well-developed and well-nourished. No distress.  Psychiatric: She has a normal mood and affect. Her behavior is normal.  Nursing note and vitals reviewed.  Results for orders placed or performed in visit on 04/19/17  TSH  Result Value Ref Range   TSH 1.03 0.35 - 4.50 uIU/mL      Assessment & Plan:   Problem List Items Addressed This Visit    Moderate episode of recurrent major depressive disorder (HCC)    Overall doing better. Will continue current prozac dose. Discussed ativan use. Update us over MyChart in 3 months. Pt agrees with plan. Will send prozac to mail order pharmacy.  PHQ9 = 7 GAD7 = 4      Relevant Medications   FLUoxetine (PROZAC) 10 MG capsule       Follow up plan: Return if symptoms worsen or fail to improve.  Eustaquio BoydenJavier Kahdijah Errickson, MD

## 2017-05-24 NOTE — Patient Instructions (Addendum)
I'm glad you're doing better. Continue prozac 10mg  daily. May try ativan on weekend at bedtime to see how it affects you.  Continue current regimen.  Send me an update in 3 months via mychart with how we're doing, return sooner if not doing well.

## 2018-01-03 NOTE — Progress Notes (Signed)
Patient ID: Sherry Lara, female   DOB: 07/21/1980, 37 y.o.   MRN: [id number] ANNUAL PREVENTATIVE CARE GYN  ENCOUNTER NOTE  Subjective:       Sherry Lara is a 37 y.o. [phone] female here for a routine annual gynecologic exam.  Current complaints: 1.  Last month cycle lasted 3 weeks; no associated pain.  Menstrual cycles are mostley regular and not painful or heavy. Patient reports no significant major interval health issues, new medications, or allergies. Bowel and bladder function are normal.   Patient is now working both at the 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   Gynecologic History lmp-01/03/2018 Contraception: vasectomy Last Pap: 12/2016 neg/neg. Results were: normal Last mammogram: n/a. Results were: abnormal  Obstetric History OB History  Gravida Para Term Preterm AB Living  4 2 2   1 3   SAB TAB Ectopic Multiple Live Births  1       3    # Outcome Date GA Lbr Len/2nd Weight Sex Delivery Anes PTL Lv  4 SAB           3 Term      CS-LTranv   LIV  2 Term      CS-LTranv   LIV  1 Gravida      CS-LTranv   LIV    Past Medical History:  Diagnosis Date  . Acne   . Anxiety   . Asthma   . Depression   . GDM (gestational diabetes mellitus)   . History of asthma    childhood  . History of chicken pox   . History of depression    and anxiety; prior on wellbutrin/celexa  . Hx of migraines    improved after pregnancies  . Migraines   . Vaginal irritation     Past Surgical History:  Procedure Laterality Date  . ADENOIDECTOMY    . CESAREAN SECTION  2008,2009,2013  . CHOLECYSTECTOMY  2009  . TONSILLECTOMY  [id number]  . WISDOM TOOTH EXTRACTION      Current Outpatient Medications on File Prior to Visit  Medication Sig Dispense Refill  . FLUoxetine (PROZAC) 10 MG capsule Take 1 capsule (10 mg total) by mouth daily. 90 capsule 1  . fluticasone (FLONASE) 50 MCG/ACT nasal spray Place 2 sprays into both nostrils daily. 16 g 11  .  LORazepam (ATIVAN) 0.5 MG tablet Take 1 tablet (0.5 mg total) by mouth 2 (two) times daily as needed for anxiety. 30 tablet 0   No current facility-administered medications on file prior to visit.     Allergies  Allergen Reactions  . Vicodin [Hydrocodone-Acetaminophen] Nausea And Vomiting  . Codeine Rash  . Penicillins Rash    Social History   Socioeconomic History  . Marital status: Married    Spouse name: Not on file  . Number of children: Not on file  . Years of education: Not on file  . Highest education level: Not on file  Occupational History  . Not on file  Social Needs  . Financial resource strain: Not on file  . Food insecurity:    Worry: Not on file    Inability: Not on file  . Transportation needs:    Medical: Not on file    Non-medical: Not on file  Tobacco Use  . Smoking status: Never Smoker  . Smokeless tobacco: Never Used  Substance and Sexual Activity  . Alcohol use: Yes    Alcohol/week: 0.0 oz    Comment: occasional  .  Drug use: No  . Sexual activity: Yes    Partners: Male    Comment: vasectomy  Lifestyle  . Physical activity:    Days per week: Not on file    Minutes per session: Not on file  . Stress: Not on file  Relationships  . Social connections:    Talks on phone: Not on file    Gets together: Not on file    Attends religious service: Not on file    Active member of club or organization: Not on file    Attends meetings of clubs or organizations: Not on file    Relationship status: Not on file  . Intimate partner violence:    Fear of current or ex partner: Not on file    Emotionally abused: Not on file    Physically abused: Not on file    Forced sexual activity: Not on file  Other Topics Concern  . Not on file  Social History Narrative   "[name]"   Lives with husband Renato Gails and 3 children, 1 dog   Occupation: Horticulturist, commercial works at [employer]   Edu: [employer]: runs, crossfit   Diet: good water, fruits/vegetables daily.  One daughter is vegetarian    Family History  Problem Relation Age of Onset  . Hypertension Mother   . Hypertension Father   . Hyperlipidemia Father        possibly  . CAD Neg Hx   . Stroke Neg Hx   . Diabetes Neg Hx   . Cancer Neg Hx     The following portions of the patient's history were reviewed and updated as appropriate: allergies, current medications, past family history, past medical history, past social history, past surgical history and problem list.  Review of Systems Review of Systems  Constitutional: Negative.   HENT: Negative.   Respiratory: Negative.   Cardiovascular: Negative.   Gastrointestinal: Negative.   Genitourinary: Negative.   Musculoskeletal: Negative.   Skin: Negative.   Neurological: Negative.   Endo/Heme/Allergies: Negative.   Psychiatric/Behavioral: Negative.       Objective:   BP 133/72   Pulse 78   Ht 5\' 5"  (1.651 m)   Wt 151 lb 1.6 oz (68.5 kg)   LMP 01/03/2018 (Exact Date)   BMI 25.14 kg/m  CONSTITUTIONAL: Well-developed, well-nourished female in no acute distress.  PSYCHIATRIC: Normal mood and affect. Normal behavior. Normal judgment and thought content. NEUROLGIC: Alert and oriented to person, place, and time. Normal muscle tone coordination. No cranial nerve deficit noted. HENT:  Normocephalic, atraumatic, External right and left ear normal.  EYES: Conjunctivae and EOM are normal. . No scleral icterus.  NECK: Normal range of motion, supple, no masses.  Normal thyroid.  SKIN: Skin is warm and dry. No rash noted. Not diaphoretic. No erythema. No pallor. CARDIOVASCULAR: Normal heart rate noted, regular rhythm, no murmur. RESPIRATORY: Clear to auscultation bilaterally. Effort and breath sounds normal, no problems with respiration noted. BREASTS: Symmetric in size. No masses, skin changes, nipple drainage, or lymphadenopathy. ABDOMEN: Soft, normal bowel sounds, no distention noted.  No tenderness, rebound or guarding. .  Well-healed  Pfannenstiel incision noted; no hernia BLADDER: Normal PELVIC:  External Genitalia: Normal  BUS: Normal  Vagina: Normal; menstrual blood, old, involved  Cervix: Normal; no lesions; no cervical motion tenderness  Uterus: Normal; midplane, normal size and shape, mobile, nontender  Adnexa: Normal; nonpalpable nontender  RV: External Exam NormaI  MUSCULOSKELETAL: Normal range of motion. No tenderness.  No cyanosis, clubbing, or edema.  2+ distal pulses. LYMPHATIC: No Axillary, Supraclavicular, or Inguinal Adenopathy.    Assessment:   Annual gynecologic examination 37 y.o. Contraception: vasectomy Normal BMI  Menorrhagia with regular cycle (once)   Plan:  Pap:Due 2021 Mammogram: Not Indicated Stool Guaiac Testing:  Not Indicated Labs: thru pcp Routine preventative health maintenance measures emphasized: Exercise/Diet/Weight control, Tobacco Warnings and Alcohol/Substance use risks  Maintain menstrual calendar monitoring; if abnormal over the next 3 cycles, return for reevaluation Return to Clinic - 1 [address] [place], CMA  Herold Harms, MD   Note: This dictation was prepared with Dragon dictation along with smaller phrase technology. Any transcriptional errors that result from this process are unintentional.

## 2018-01-07 ENCOUNTER — Encounter: Payer: Self-pay | Admitting: Obstetrics and Gynecology

## 2018-01-07 ENCOUNTER — Ambulatory Visit (INDEPENDENT_AMBULATORY_CARE_PROVIDER_SITE_OTHER): Payer: BLUE CROSS/BLUE SHIELD | Admitting: Obstetrics and Gynecology

## 2018-01-07 VITALS — BP 133/72 | HR 78 | Ht 65.0 in | Wt 151.1 lb

## 2018-01-07 DIAGNOSIS — Z98891 History of uterine scar from previous surgery: Secondary | ICD-10-CM | POA: Diagnosis not present

## 2018-01-07 DIAGNOSIS — N920 Excessive and frequent menstruation with regular cycle: Secondary | ICD-10-CM

## 2018-01-07 DIAGNOSIS — Z01419 Encounter for gynecological examination (general) (routine) without abnormal findings: Secondary | ICD-10-CM | POA: Diagnosis not present

## 2018-01-07 NOTE — Patient Instructions (Addendum)
1.  Pap smear is not done.  Next Pap smear is due 2021 2.  Self breast awareness is encouraged 3.  Screening labs are to be obtained through primary care 4.  Continue with healthy eating and exercise 5.  Maintain menstrual calendar monitoring for abnormal uterine bleeding 6.  Return in 1 year for annual exam 7.  Return in 3 months if bleeding remains abnormal-greater than 7-day menses  Health Maintenance, Female Adopting a healthy lifestyle and getting preventive care can go a long way to promote health and wellness. Talk with your health care provider about what schedule of regular examinations is right for you. This is a good chance for you to check in with your provider about disease prevention and staying healthy. In between checkups, there are plenty of things you can do on your own. Experts have done a lot of research about which lifestyle changes and preventive measures are most likely to keep you healthy. Ask your health care provider for more information. Weight and diet Eat a healthy diet  Be sure to include plenty of vegetables, fruits, low-fat dairy products, and lean protein.  Do not eat a lot of foods high in solid fats, added sugars, or salt.  Get regular exercise. This is one of the most important things you can do for your health. ? Most adults should exercise for at least 150 minutes each week. The exercise should increase your heart rate and make you sweat (moderate-intensity exercise). ? Most adults should also do strengthening exercises at least twice a week. This is in addition to the moderate-intensity exercise.  Maintain a healthy weight  Body mass index (BMI) is a measurement that can be used to identify possible weight problems. It estimates body fat based on height and weight. Your health care provider can help determine your BMI and help you achieve or maintain a healthy weight.  For females 29 years of age and older: ? A BMI below 18.5 is considered  underweight. ? A BMI of 18.5 to 24.9 is normal. ? A BMI of 25 to 29.9 is considered overweight. ? A BMI of 30 and above is considered obese.  Watch levels of cholesterol and blood lipids  You should start having your blood tested for lipids and cholesterol at 38 years of age, then have this test every 5 years.  You may need to have your cholesterol levels checked more often if: ? Your lipid or cholesterol levels are high. ? You are older than 38 years of age. ? You are at high risk for heart disease.  Cancer screening Lung Cancer  Lung cancer screening is recommended for adults 56-20 years old who are at high risk for lung cancer because of a history of smoking.  A yearly low-dose CT scan of the lungs is recommended for people who: ? Currently smoke. ? Have quit within the past 15 years. ? Have at least a 30-pack-year history of smoking. A pack year is smoking an average of one pack of cigarettes a day for 1 year.  Yearly screening should continue until it has been 15 years since you quit.  Yearly screening should stop if you develop a health problem that would prevent you from having lung cancer treatment.  Breast Cancer  Practice breast self-awareness. This means understanding how your breasts normally appear and feel.  It also means doing regular breast self-exams. Let your health care provider know about any changes, no matter how small.  If you are in your  80s or 73s, you should have a clinical breast exam (CBE) by a health care provider every 1-3 years as part of a regular health exam.  If you are 4 or older, have a CBE every year. Also consider having a breast X-ray (mammogram) every year.  If you have a family history of breast cancer, talk to your health care provider about genetic screening.  If you are at high risk for breast cancer, talk to your health care provider about having an MRI and a mammogram every year.  Breast cancer gene (BRCA) assessment is  recommended for women who have family members with BRCA-related cancers. BRCA-related cancers include: ? Breast. ? Ovarian. ? Tubal. ? Peritoneal cancers.  Results of the assessment will determine the need for genetic counseling and BRCA1 and BRCA2 testing.  Cervical Cancer Your health care provider may recommend that you be screened regularly for cancer of the pelvic organs (ovaries, uterus, and vagina). This screening involves a pelvic examination, including checking for microscopic changes to the surface of your cervix (Pap test). You may be encouraged to have this screening done every 3 years, beginning at age 106.  For women ages 68-65, health care providers may recommend pelvic exams and Pap testing every 3 years, or they may recommend the Pap and pelvic exam, combined with testing for human papilloma virus (HPV), every 5 years. Some types of HPV increase your risk of cervical cancer. Testing for HPV may also be done on women of any age with unclear Pap test results.  Other health care providers may not recommend any screening for nonpregnant women who are considered low risk for pelvic cancer and who do not have symptoms. Ask your health care provider if a screening pelvic exam is right for you.  If you have had past treatment for cervical cancer or a condition that could lead to cancer, you need Pap tests and screening for cancer for at least 20 years after your treatment. If Pap tests have been discontinued, your risk factors (such as having a new sexual partner) need to be reassessed to determine if screening should resume. Some women have medical problems that increase the chance of getting cervical cancer. In these cases, your health care provider may recommend more frequent screening and Pap tests.  Colorectal Cancer  This type of cancer can be detected and often prevented.  Routine colorectal cancer screening usually begins at 38 years of age and continues through 38 years of  age.  Your health care provider may recommend screening at an earlier age if you have risk factors for colon cancer.  Your health care provider may also recommend using home test kits to check for hidden blood in the stool.  A small camera at the end of a tube can be used to examine your colon directly (sigmoidoscopy or colonoscopy). This is done to check for the earliest forms of colorectal cancer.  Routine screening usually begins at age 33.  Direct examination of the colon should be repeated every 5-10 years through 37 years of age. However, you may need to be screened more often if early forms of precancerous polyps or small growths are found.  Skin Cancer  Check your skin from head to toe regularly.  Tell your health care provider about any new moles or changes in moles, especially if there is a change in a mole's shape or color.  Also tell your health care provider if you have a mole that is larger than the size of  a pencil eraser.  Always use sunscreen. Apply sunscreen liberally and repeatedly throughout the day.  Protect yourself by wearing long sleeves, pants, a wide-brimmed hat, and sunglasses whenever you are outside.  Heart disease, diabetes, and high blood pressure  High blood pressure causes heart disease and increases the risk of stroke. High blood pressure is more likely to develop in: ? People who have blood pressure in the high end of the normal range (130-139/85-89 mm Hg). ? People who are overweight or obese. ? People who are African American.  If you are 45-2 years of age, have your blood pressure checked every 3-5 years. If you are 68 years of age or older, have your blood pressure checked every year. You should have your blood pressure measured twice-once when you are at a hospital or clinic, and once when you are not at a hospital or clinic. Record the average of the two measurements. To check your blood pressure when you are not at a hospital or clinic, you  can use: ? An automated blood pressure machine at a pharmacy. ? A home blood pressure monitor.  If you are between 65 years and 22 years old, ask your health care provider if you should take aspirin to prevent strokes.  Have regular diabetes screenings. This involves taking a blood sample to check your fasting blood sugar level. ? If you are at a normal weight and have a low risk for diabetes, have this test once every three years after 38 years of age. ? If you are overweight and have a high risk for diabetes, consider being tested at a younger age or more often. Preventing infection Hepatitis B  If you have a higher risk for hepatitis B, you should be screened for this virus. You are considered at high risk for hepatitis B if: ? You were born in a country where hepatitis B is common. Ask your health care provider which countries are considered high risk. ? Your parents were born in a high-risk country, and you have not been immunized against hepatitis B (hepatitis B vaccine). ? You have HIV or AIDS. ? You use needles to inject street drugs. ? You live with someone who has hepatitis B. ? You have had sex with someone who has hepatitis B. ? You get hemodialysis treatment. ? You take certain medicines for conditions, including cancer, organ transplantation, and autoimmune conditions.  Hepatitis C  Blood testing is recommended for: ? Everyone born from [birth year] through 1966. ? Anyone with known risk factors for hepatitis C.  Sexually transmitted infections (STIs)  You should be screened for sexually transmitted infections (STIs) including gonorrhea and chlamydia if: ? You are sexually active and are younger than 38 years of age. ? You are older than 38 years of age and your health care provider tells you that you are at risk for this type of infection. ? Your sexual activity has changed since you were last screened and you are at an increased risk for chlamydia or gonorrhea. Ask your  health care provider if you are at risk.  If you do not have HIV, but are at risk, it may be recommended that you take a prescription medicine daily to prevent HIV infection. This is called pre-exposure prophylaxis (PrEP). You are considered at risk if: ? You are sexually active and do not regularly use condoms or know the HIV status of your partner(s). ? You take drugs by injection. ? You are sexually active with a partner who has  HIV.  Talk with your health care provider about whether you are at high risk of being infected with HIV. If you choose to begin PrEP, you should first be tested for HIV. You should then be tested every 3 months for as long as you are taking PrEP. Pregnancy  If you are premenopausal and you may become pregnant, ask your health care provider about preconception counseling.  If you may become pregnant, take 400 to 800 micrograms (mcg) of folic acid every day.  If you want to prevent pregnancy, talk to your health care provider about birth control (contraception). Osteoporosis and menopause  Osteoporosis is a disease in which the bones lose minerals and strength with aging. This can result in serious bone fractures. Your risk for osteoporosis can be identified using a bone density scan.  If you are 19 years of age or older, or if you are at risk for osteoporosis and fractures, ask your health care provider if you should be screened.  Ask your health care provider whether you should take a calcium or vitamin D supplement to lower your risk for osteoporosis.  Menopause may have certain physical symptoms and risks.  Hormone replacement therapy may reduce some of these symptoms and risks. Talk to your health care provider about whether hormone replacement therapy is right for you. Follow these instructions at home:  Schedule regular health, dental, and eye exams.  Stay current with your immunizations.  Do not use any tobacco products including cigarettes, chewing  tobacco, or electronic cigarettes.  If you are pregnant, do not drink alcohol.  If you are breastfeeding, limit how much and how often you drink alcohol.  Limit alcohol intake to no more than 1 drink per day for nonpregnant women. One drink equals 12 ounces of beer, 5 ounces of wine, or 1 ounces of hard liquor.  Do not use street drugs.  Do not share needles.  Ask your health care provider for help if you need support or information about quitting drugs.  Tell your health care provider if you often feel depressed.  Tell your health care provider if you have ever been abused or do not feel safe at home. This information is not intended to replace advice given to you by your health care provider. Make sure you discuss any questions you have with your health care provider. Document Released: 12/25/2010 Document Revised: 11/17/2015 Document Reviewed: 03/15/2015 Elsevier Interactive Patient Education  [employer].

## 2018-07-23 ENCOUNTER — Encounter: Payer: Self-pay | Admitting: Family Medicine

## 2018-07-23 ENCOUNTER — Ambulatory Visit (INDEPENDENT_AMBULATORY_CARE_PROVIDER_SITE_OTHER): Payer: BLUE CROSS/BLUE SHIELD | Admitting: Family Medicine

## 2018-07-23 VITALS — BP 118/78 | HR 65 | Temp 98.6°F | Ht 65.0 in | Wt 147.5 lb

## 2018-07-23 DIAGNOSIS — J029 Acute pharyngitis, unspecified: Secondary | ICD-10-CM | POA: Insufficient documentation

## 2018-07-23 LAB — POCT RAPID STREP A (OFFICE): RAPID STREP A SCREEN: NEGATIVE

## 2018-07-23 LAB — POC INFLUENZA A&B (BINAX/QUICKVUE)
INFLUENZA A, POC: NEGATIVE
Influenza B, POC: NEGATIVE

## 2018-07-23 NOTE — Assessment & Plan Note (Signed)
Anticipate viral given short duration and no localizing symptoms. RST neg, flu swab neg. Supportive care as per instructions.

## 2018-07-23 NOTE — Progress Notes (Signed)
BP 118/78 (BP Location: Left Arm, Patient Position: Sitting, Cuff Size: Normal)   Pulse 65   Temp 98.6 F (37 C) (Oral)   Ht 5\' 5"  (1.651 m)   Wt 147 lb 8 oz (66.9 kg)   LMP 07/23/2018   SpO2 95%   BMI 24.55 kg/m    CC: cough, congestion Subjective:    Patient ID: Sherry Lara, female    DOB: 25-Jan-1981, 38 y.o.   MRN: [id number]  HPI: Sherry Lara is a 38 y.o. female presenting on 07/23/2018 for Sore Throat (C/o sore throat, nasal congestion, runny nose, HA and achiness. Denies and fever. Sxs started yesterday. Tried ibuprofen.)   1d h/o sudden onset fatigue, body aches, ST, swollen glands, HA, trouble sleeping last night due to ST. Sudden onset. Nasal congestion, rhinorrhea, PNdrainage.   No cough, ear or tooth pain, no fevers/chills. No dyspnea or wheezing.   Took ibuprofen this morning.   She did receive flu shot.  Son had ear infection recently.  Non smoker.  Childhood asthma, had flare with URI 8 yrs ago.      Relevant past medical, surgical, family and social history reviewed and updated as indicated. Interim medical history since our last visit reviewed. Allergies and medications reviewed and updated. No outpatient medications prior to visit.   No facility-administered medications prior to visit.      Per HPI unless specifically indicated in ROS section below Review of Systems Objective:    BP 118/78 (BP Location: Left Arm, Patient Position: Sitting, Cuff Size: Normal)   Pulse 65   Temp 98.6 F (37 C) (Oral)   Ht 5\' 5"  (1.651 m)   Wt 147 lb 8 oz (66.9 kg)   LMP 07/23/2018   SpO2 95%   BMI 24.55 kg/m   Wt Readings from Last 3 Encounters:  07/23/18 147 lb 8 oz (66.9 kg)  01/07/18 151 lb 1.6 oz (68.5 kg)  05/24/17 150 lb (68 kg)    Physical Exam Vitals signs and nursing note reviewed.  Constitutional:      General: She is not in acute distress.    Appearance: Normal appearance. She is well-developed. She is not  ill-appearing.     Comments: Tired appearing  HENT:     Head: Normocephalic and atraumatic.     Right Ear: Hearing, tympanic membrane, ear canal and external ear normal.     Left Ear: Hearing, tympanic membrane, ear canal and external ear normal.     Nose: Mucosal edema and congestion present. No rhinorrhea.     Right Sinus: Maxillary sinus tenderness present. No frontal sinus tenderness.     Left Sinus: Maxillary sinus tenderness present. No frontal sinus tenderness.     Mouth/Throat:     Mouth: Mucous membranes are moist.     Tongue: No lesions.     Pharynx: Oropharynx is clear. Uvula midline. Posterior oropharyngeal erythema present. No oropharyngeal exudate.     Tonsils: No tonsillar exudate or tonsillar abscesses.     Comments: Raw erythematous posterior oropharynx Eyes:     General: No scleral icterus.    Conjunctiva/sclera: Conjunctivae normal.     Pupils: Pupils are equal, round, and reactive to light.  Neck:     Musculoskeletal: Normal range of motion and neck supple. Muscular tenderness (bilateral AC tenderness) present.  Cardiovascular:     Rate and Rhythm: Normal rate and regular rhythm.     Pulses: Normal pulses.     Heart sounds: Normal heart  sounds. No murmur.  Pulmonary:     Effort: Pulmonary effort is normal. No respiratory distress.     Breath sounds: Normal breath sounds. No wheezing, rhonchi or rales.  Lymphadenopathy:     Cervical: No cervical adenopathy.  Skin:    General: Skin is warm and dry.     Findings: No rash.  Neurological:     Mental Status: She is alert.       Results for orders placed or performed in visit on 07/23/18  POC Influenza A&B(BINAX/QUICKVUE)  Result Value Ref Range   Influenza A, POC Negative Negative   Influenza B, POC Negative Negative  POCT rapid strep A  Result Value Ref Range   Rapid Strep A Screen Negative Negative    Assessment & Plan:   Problem List Items Addressed This Visit    Acute pharyngitis - Primary     Anticipate viral given short duration and no localizing symptoms. RST neg, flu swab neg. Supportive care as per instructions.        Other Visit Diagnoses    Sore throat       Relevant Orders   POC Influenza A&B(BINAX/QUICKVUE) (Completed)   POCT rapid strep A (Completed)       No orders of the defined types were placed in this encounter.  Orders Placed This Encounter  Procedures  . POC Influenza A&B(BINAX/QUICKVUE)  . POCT rapid strep A   Patient Instructions  You have acute viral pharyngitis. Push fluids and plenty of rest. May use ibuprofen for throat inflammation. Salt water gargles. Suck on cold things like popsicles or warm things like herbal teas (whichever soothes the throat better). Return if fever >101.5, worsening pain, or trouble opening/closing mouth, or hoarse voice. Good to see you today, call clinic with questions.    Follow up plan: Return if symptoms worsen or fail to improve.  Eustaquio BoydenJavier Ahnyla Mendel, MD

## 2018-07-23 NOTE — Patient Instructions (Addendum)
You have acute viral pharyngitis. Push fluids and plenty of rest. May use ibuprofen for throat inflammation. Salt water gargles. Suck on cold things like popsicles or warm things like herbal teas (whichever soothes the throat better). Return if fever >101.5, worsening pain, or trouble opening/closing mouth, or hoarse voice. Good to see you today, call clinic with questions.  

## 2018-10-28 ENCOUNTER — Other Ambulatory Visit: Payer: Self-pay | Admitting: Family Medicine

## 2018-10-30 NOTE — Telephone Encounter (Signed)
plz call and touch base with patient. We received refill request for prozac. Previously off this med. Is she desiring to start antidepressant again? If so, ok to refill but would recommend 1 mo follow up visit (ok to do virtual visit with PHQ9/GAD7 prior)

## 2018-10-30 NOTE — Telephone Encounter (Signed)
Patient returned Lisa's call.  Patient said she's not taking that medication anymore.  The pharmacy made a mistake.  She called the pharmacy and they corrected the mistake.

## 2018-10-30 NOTE — Telephone Encounter (Signed)
Left message on vm per dpr relaying Dr. G's message.  

## 2019-01-13 ENCOUNTER — Encounter: Payer: BLUE CROSS/BLUE SHIELD | Admitting: Obstetrics and Gynecology

## 2019-01-13 NOTE — Patient Instructions (Addendum)
Preventive Care 20-39 Years Old, Female Preventive care refers to visits with your health care provider and lifestyle choices that can promote health and wellness. This includes:  A yearly physical exam. This may also be called an annual well check.  Regular dental visits and eye exams.  Immunizations.  Screening for certain conditions.  Healthy lifestyle choices, such as eating a healthy diet, getting regular exercise, not using drugs or products that contain nicotine and tobacco, and limiting alcohol use. What can I expect for my preventive care visit? Physical exam Your health care provider will check your:  Height and weight. This may be used to calculate body mass index (BMI), which tells if you are at a healthy weight.  Heart rate and blood pressure.  Skin for abnormal spots. Counseling Your health care provider may ask you questions about your:  Alcohol, tobacco, and drug use.  Emotional well-being.  Home and relationship well-being.  Sexual activity.  Eating habits.  Work and work Statistician.  Method of birth control.  Menstrual cycle.  Pregnancy history. What immunizations do I need?  Influenza (flu) vaccine  This is recommended every year. Tetanus, diphtheria, and pertussis (Tdap) vaccine  You may need a Td booster every 10 years. Varicella (chickenpox) vaccine  You may need this if you have not been vaccinated. Human papillomavirus (HPV) vaccine  If recommended by your health care provider, you may need three doses over 6 months. Measles, mumps, and rubella (MMR) vaccine  You may need at least one dose of MMR. You may also need a second dose. Meningococcal conjugate (MenACWY) vaccine  One dose is recommended if you are age 75-21 years and a first-year college student living in a residence hall, or if you have one of several medical conditions. You may also need additional booster doses. Pneumococcal conjugate (PCV13) vaccine  You may need  this if you have certain conditions and were not previously vaccinated. Pneumococcal polysaccharide (PPSV23) vaccine  You may need one or two doses if you smoke cigarettes or if you have certain conditions. Hepatitis A vaccine  You may need this if you have certain conditions or if you travel or work in places where you may be exposed to hepatitis A. Hepatitis B vaccine  You may need this if you have certain conditions or if you travel or work in places where you may be exposed to hepatitis B. Haemophilus influenzae type b (Hib) vaccine  You may need this if you have certain conditions. You may receive vaccines as individual doses or as more than one vaccine together in one shot (combination vaccines). Talk with your health care provider about the risks and benefits of combination vaccines. What tests do I need?  Blood tests  Lipid and cholesterol levels. These may be checked every 5 years starting at age 33.  Hepatitis C test.  Hepatitis B test. Screening  Diabetes screening. This is done by checking your blood sugar (glucose) after you have not eaten for a while (fasting).  Sexually transmitted disease (STD) testing.  BRCA-related cancer screening. This may be done if you have a family history of breast, ovarian, tubal, or peritoneal cancers.  Pelvic exam and Pap test. This may be done every 3 years starting at age 76. Starting at age 102, this may be done every 5 years if you have a Pap test in combination with an HPV test. Talk with your health care provider about your test results, treatment options, and if necessary, the need for more tests.  Follow these instructions at home: °Eating and drinking ° °· Eat a diet that includes fresh fruits and vegetables, whole grains, lean protein, and low-fat dairy. °· Take vitamin and mineral supplements as recommended by your health care provider. °· Do not drink alcohol if: °? Your health care provider tells you not to drink. °? You are  pregnant, may be pregnant, or are planning to become pregnant. °· If you drink alcohol: °? Limit how much you have to 0-1 drink a day. °? Be aware of how much alcohol is in your drink. In the [place] one drink equals one 12 oz bottle of beer (355 mL), one 5 oz glass of wine (148 mL), or one 1½ oz glass of hard liquor (44 mL). °Lifestyle °· Take daily care of your teeth and gums. °· Stay active. Exercise for at least 30 minutes on 5 or more days each week. °· Do not use any products that contain nicotine or tobacco, such as cigarettes, e-cigarettes, and chewing tobacco. If you need help quitting, ask your health care provider. °· If you are sexually active, practice safe sex. Use a condom or other form of birth control (contraception) in order to prevent pregnancy and STIs (sexually transmitted infections). If you plan to become pregnant, see your health care provider for a preconception visit. °What's next? °· Visit your health care provider once a year for a well check visit. °· Ask your health care provider how often you should have your eyes and teeth checked. °· Stay up to date on all vaccines. °This information is not intended to replace advice given to you by your health care provider. Make sure you discuss any questions you have with your health care provider. °Document Released: 08/07/2001 Document Revised: 02/20/2018 Document Reviewed: 02/20/2018 °Elsevier Patient Education © 2020 [employer] °Breast Self-Awareness °Breast self-awareness is knowing how your breasts look and feel. Doing breast self-awareness is important. It allows you to catch a breast problem early while it is still small and can be treated. All women should do breast self-awareness, including women who have had breast implants. Tell your doctor if you notice a change in your breasts. °What you need: °· A mirror. °· A well-lit room. °How to do a breast self-exam °A breast self-exam is one way to learn what is normal for your breasts and to  check for changes. To do a breast self-exam: °Look for changes ° °1. Take off all the clothes above your waist. °2. Stand in front of a mirror in a room with good lighting. °3. Put your hands on your hips. °4. Push your hands down. °5. Look at your breasts and nipples in the mirror to see if one breast or nipple looks different from the other. Check to see if: °? The shape of one breast is different. °? The size of one breast is different. °? There are wrinkles, dips, and bumps in one breast and not the other. °6. Look at each breast for changes in the skin, such as: °? Redness. °? Scaly areas. °7. Look for changes in your nipples, such as: °? Liquid around the nipples. °? Bleeding. °? Dimpling. °? Redness. °? A change in where the nipples are. °Feel for changes ° °1. Lie on your back on the floor. °2. Feel each breast. To do this, follow these steps: °? Pick a breast to feel. °? Put the arm closest to that breast above your head. °? Use your other arm to feel the nipple area of   your breast. Feel the area with the pads of your three middle fingers by making small circles with your fingers. For the first circle, press lightly. For the second circle, press harder. For the third circle, press even harder. °? Keep making circles with your fingers at the different pressures as you move down your breast. Stop when you feel your ribs. °? Move your fingers a little toward the center of your body. °? Start making circles with your fingers again, this time going up until you reach your collarbone. °? Keep making up-and-down circles until you reach your armpit. Remember to keep using the three pressures. °? Feel the other breast in the same way. °3. Sit or stand in the tub or shower. °4. With soapy water on your skin, feel each breast the same way you did in step 2 when you were lying on the floor. °Write down what you find °Writing down what you find can help you remember what to tell your doctor. Write down: °· What is  normal for each breast. °· Any changes you find in each breast, including: °? The kind of changes you find. °? Whether you have pain. °? Size and location of any lumps. °· When you last had your menstrual period. °General tips °· Check your breasts every month. °· If you are breastfeeding, the best time to check your breasts is after you feed your baby or after you use a breast pump. °· If you get menstrual periods, the best time to check your breasts is 5-7 days after your menstrual period is over. °· With time, you will become comfortable with the self-exam, and you will begin to know if there are changes in your breasts. °Contact a doctor if you: °· See a change in the shape or size of your breasts or nipples. °· See a change in the skin of your breast or nipples, such as red or scaly skin. °· Have fluid coming from your nipples that is not normal. °· Find a lump or thick area that was not there before. °· Have pain in your breasts. °· Have any concerns about your breast health. °Summary °· Breast self-awareness includes looking for changes in your breasts, as well as feeling for changes within your breasts. °· Breast self-awareness should be done in front of a mirror in a well-lit room. °· You should check your breasts every month. If you get menstrual periods, the best time to check your breasts is 5-7 days after your menstrual period is over. °· Let your doctor know of any changes you see in your breasts, including changes in size, changes on the skin, pain or tenderness, or fluid from your nipples that is not normal. °This information is not intended to replace advice given to you by your health care provider. Make sure you discuss any questions you have with your health care provider. °Document Released: 11/28/2007 Document Revised: 01/28/2018 Document Reviewed: 01/28/2018 °Elsevier Patient Education © 2020 [employer] ° °

## 2019-01-13 NOTE — Progress Notes (Signed)
Pt is present today for annual exam. Pt stated having itching and swelling in the vaginal area and upper thighs. Pt stated having this issues several years ago and was treated by Dr. Enzo Bi. Pt stated that she has a lot of anxiety when she comes to the doctor.

## 2019-01-14 ENCOUNTER — Ambulatory Visit (INDEPENDENT_AMBULATORY_CARE_PROVIDER_SITE_OTHER): Payer: BLUE CROSS/BLUE SHIELD | Admitting: Obstetrics and Gynecology

## 2019-01-14 ENCOUNTER — Encounter: Payer: Self-pay | Admitting: Obstetrics and Gynecology

## 2019-01-14 ENCOUNTER — Other Ambulatory Visit: Payer: Self-pay

## 2019-01-14 VITALS — BP 102/74 | HR 72 | Ht 65.0 in | Wt 154.0 lb

## 2019-01-14 DIAGNOSIS — Z01419 Encounter for gynecological examination (general) (routine) without abnormal findings: Secondary | ICD-10-CM | POA: Diagnosis not present

## 2019-01-14 DIAGNOSIS — Z1231 Encounter for screening mammogram for malignant neoplasm of breast: Secondary | ICD-10-CM | POA: Diagnosis not present

## 2019-01-14 DIAGNOSIS — Z8659 Personal history of other mental and behavioral disorders: Secondary | ICD-10-CM | POA: Diagnosis not present

## 2019-01-14 DIAGNOSIS — N898 Other specified noninflammatory disorders of vagina: Secondary | ICD-10-CM

## 2019-01-14 MED ORDER — TRIAMCINOLONE ACETONIDE 0.1 % EX CREA: 1.0000 "application " | TOPICAL_CREAM | Freq: Two times a day (BID) | CUTANEOUS | 0 refills | Status: DC | PRN

## 2019-01-14 NOTE — Progress Notes (Signed)
GYNECOLOGY ANNUAL PHYSICAL EXAM PROGRESS NOTE  Subjective:    Sherry LabrumKatherine E Wicke Lara is a 38 y.o. [phone] female who presents for an annual exam. The patient is sexually active. The patient wears seatbelts: yes. The patient participates in regular exercise: yes. The patient reports that there is not domestic violence in her life.  Of note, patient reports severe anxiety with doctor appointments.    The patient has the following complaints today:  1. Patient reports itching and swelling in the vaginal area and upper thigh region. Reports redness over the last few days that has been resolving. Notes this happened once before several years ago, but got so bad that she required an Emergency Room visit and treatment with steroids to help as she was unable to void. Notes that it was caused by a contact dermatitis from a dye in her new jeans.   Gynecologic History  Menarche age: [age] Patient's last menstrual period was 01/03/2019. Contraception: vasectomy  Last Pap: 12/2016. Results were: normal.  Denies h/o abnormal pap smears. Last mammogram: patient has never had a mammogram.    OB History  Gravida Para Term Preterm AB Living  4 2 2  0 1 3  SAB TAB Ectopic Multiple Live Births  1 0 0 0 3    # Outcome Date GA Lbr Len/2nd Weight Sex Delivery Anes PTL Lv  4 SAB           3 Term      CS-LTranv   LIV  2 Term      CS-LTranv   LIV  1 Gravida      CS-LTranv   LIV    Past Medical History:  Diagnosis Date  . Acne   . Anxiety   . Asthma   . Depression   . GDM (gestational diabetes mellitus)   . History of asthma    childhood  . History of chicken pox   . History of depression    and anxiety; prior on wellbutrin/celexa  . Hx of migraines    improved after pregnancies  . Migraines   . Vaginal irritation     Past Surgical History:  Procedure Laterality Date  . ADENOIDECTOMY    . CESAREAN SECTION  2008,2009,2013  . CHOLECYSTECTOMY  2009  . TONSILLECTOMY  [id number]  . WISDOM  TOOTH EXTRACTION      Family History  Problem Relation Age of Onset  . Hypertension Father   . Hyperlipidemia Father        possibly  . CAD Neg Hx   . Stroke Neg Hx   . Diabetes Neg Hx   . Cancer Neg Hx     Social History   Socioeconomic History  . Marital status: Married    Spouse name: Not on file  . Number of children: Not on file  . Years of education: Not on file  . Highest education level: Not on file  Occupational History  . Not on file  Social Needs  . Financial resource strain: Not on file  . Food insecurity    Worry: Not on file    Inability: Not on file  . Transportation needs    Medical: Not on file    Non-medical: Not on file  Tobacco Use  . Smoking status: Never Smoker  . Smokeless tobacco: Never Used  Substance and Sexual Activity  . Alcohol use: Yes    Alcohol/week: 0.0 standard drinks    Comment: occasional  . Drug use: No  .  Sexual activity: Yes    Partners: Male    Comment: vasectomy  Lifestyle  . Physical activity    Days per week: 4 days    Minutes per session: 30 min  . Stress: Not on file  Relationships  . Social Herbalist on phone: Not on file    Gets together: Not on file    Attends religious service: Not on file    Active member of club or organization: Not on file    Attends meetings of clubs or organizations: Not on file    Relationship status: Not on file  . Intimate partner violence    Fear of current or ex partner: Not on file    Emotionally abused: Not on file    Physically abused: Not on file    Forced sexual activity: Not on file  Other Topics Concern  . 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 One daughter is vegetarian    Current Outpatient Medications on File Prior to Visit  Medication Sig Dispense Refill   . spironolactone (ALDACTONE) 50 MG tablet Take 50 mg by mouth daily.     No current facility-administered medications on file prior to visit.     Allergies  Allergen Reactions  . Vicodin [Hydrocodone-Acetaminophen] Nausea And Vomiting  . Codeine Rash  . Penicillins Rash     Review of Systems Constitutional: negative for chills, fatigue, fevers and sweats Eyes: negative for irritation, redness and visual disturbance Ears, nose, mouth, throat, and face: negative for hearing loss, nasal congestion, snoring and tinnitus Respiratory: negative for asthma, cough, sputum Cardiovascular: negative for chest pain, dyspnea, exertional chest pressure/discomfort, irregular heart beat, palpitations and syncope Gastrointestinal: negative for abdominal pain, change in bowel habits, nausea and vomiting Genitourinary: negative for abnormal menstrual periods, genital lesions, sexual problems and vaginal discharge, dysuria and urinary incontinence Integument/breast: negative for breast lump, breast tenderness and nipple discharge Hematologic/lymphatic: negative for bleeding and easy bruising Musculoskeletal:negative for back pain and muscle weakness Neurological: negative for dizziness, headaches, vertigo and weakness Endocrine: negative for diabetic symptoms including polydipsia, polyuria and skin dryness Allergic/Immunologic: negative for hay fever and urticaria        Objective:  Blood pressure (!) 152/80, pulse 72, height 5\' 5"  (1.651 m), weight 154 lb (69.9 kg), last menstrual period 01/03/2019. Body mass index is 25.63 kg/m. Repeat BP at end of visit 109/64.     General Appearance:    Alert, cooperative, appears very anxious, no distress, appears stated age  Head:    Normocephalic, without obvious abnormality, atraumatic  Eyes:    PERRL, conjunctiva/corneas clear, EOM's intact, both eyes  Ears:    Normal external ear canals, both ears  Nose:   Nares normal, septum midline, mucosa normal, no  drainage or sinus tenderness  Throat:   Lips, mucosa, and tongue normal; teeth and gums normal  Neck:   Supple, symmetrical, trachea midline, no adenopathy; thyroid: no enlargement/tenderness/nodules; no carotid bruit or JVD  Back:     Symmetric, no curvature, ROM normal, no CVA tenderness  Lungs:     Clear to auscultation bilaterally, respirations unlabored  Chest Wall:    No tenderness or deformity   Heart:    Regular rate and rhythm, S1 and S2 normal, no murmur, rub or  gallop  Breast Exam:    No tenderness, masses, or nipple abnormality  Abdomen:     Soft, non-tender, bowel sounds active all four quadrants, no masses, no organomegaly.    Genitalia:    Pelvic:external genitalia normal, vagina without lesions, or tenderness.  Small amount of dark brown discharge in vaginal vault, no odor. Rectovaginal septum  normal. Cervix normal in appearance, no cervical motion tenderness, no adnexal masses or tenderness.  Uterus normal size, shape, mobile, regular contours, nontender.  Rectal:    Normal external sphincter.  No hemorrhoids appreciated. Internal exam not done.   Extremities:   Extremities normal, atraumatic, no cyanosis or edema  Pulses:   2+ and symmetric all extremities  Skin:   Skin color, texture, turgor normal, no rashes or lesions  Lymph nodes:   Cervical, supraclavicular, and axillary nodes normal  Neurologic:   CNII-XII intact, normal strength, sensation and reflexes throughout   .  Labs:  Microscopic wet-mount exam shows scant clue cells, no hyphae, no trichomonads, no white blood cells. KOH done.     Lab Results  Component Value Date   CREATININE 0.64 03/25/2017   BUN 9 03/25/2017   NA 135 03/25/2017   K 4.2 03/25/2017   CL 102 03/25/2017   CO2 24 03/25/2017    No results found for: ALT, AST, GGT, ALKPHOS, BILITOT  Lab Results  Component Value Date   TSH 1.03 04/19/2017    Lab Results  Component Value Date   CHOL 159 03/25/2017   HDL 60.30 03/25/2017    LDLCALC 87 03/25/2017   TRIG 58.0 03/25/2017   CHOLHDL 3 03/25/2017    Assessment:   1. Encounter for well woman exam with routine gynecological exam  2. Breast cancer screening by mammogram  3. Itching in the vaginal area  4. History of anxiety  Plan:     Blood tests: None ordered. Patient will have labs done by PCP. Marland Kitchen. Breast self exam technique reviewed and patient encouraged to perform self-exam monthly. Contraception: vasectomy. Discussed healthy lifestyle modifications. Mammogram to be scheduled for February 2021. Pap smear up to date.  Next due in 2021.   History of anxiety, also with white coat syndrome. Repeat BP today normal at end of visit.  Itching in vaginal area, negative wet prep. Patient with a h/o contact dermatitis. Will give prescription for steroid cream to use as needed.  RTC in 1 year for annual exam, or sooner as needed.    Hildred Laserherry, Kalianna Verbeke, MD [facility]

## 2019-01-15 ENCOUNTER — Telehealth: Payer: Self-pay | Admitting: Obstetrics and Gynecology

## 2019-01-15 NOTE — Telephone Encounter (Signed)
Patient called stating the cream she was given st her visit is not working and the itching is getting worse. She has also taken benadryl with no relief. Please Advise

## 2019-01-15 NOTE — Telephone Encounter (Signed)
Pt called and spoke with her concerning her call to the office. Pt stated that the cream prescribed is not helping. Pt was crying and requested for something else to be called in for her as soon as possible. Pt is aware that [facility] is not in the office and message would be sent to her. Pt was informed that as soon as [facility] reply she will receive a call with information. Pt stated that she understood.

## 2019-01-16 ENCOUNTER — Other Ambulatory Visit: Payer: Self-pay

## 2019-01-16 ENCOUNTER — Telehealth: Payer: Self-pay

## 2019-01-16 MED ORDER — NYSTATIN-TRIAMCINOLONE 100000-0.1 UNIT/GM-% EX CREA: 1.0000 "application " | TOPICAL_CREAM | Freq: Two times a day (BID) | CUTANEOUS | 6 refills | Status: DC

## 2019-01-16 NOTE — Telephone Encounter (Signed)
Spoke with pt and informed of the information given by [facility] and mycolog cream was sent to her pharmacy.

## 2019-01-16 NOTE — Telephone Encounter (Signed)
Please see another phone encounter.  

## 2019-03-04 ENCOUNTER — Ambulatory Visit: Payer: Self-pay

## 2019-03-04 ENCOUNTER — Other Ambulatory Visit: Payer: Self-pay

## 2019-03-04 DIAGNOSIS — Z23 Encounter for immunization: Secondary | ICD-10-CM

## 2019-03-11 ENCOUNTER — Other Ambulatory Visit: Payer: Self-pay

## 2019-03-11 DIAGNOSIS — Z20822 Contact with and (suspected) exposure to covid-19: Secondary | ICD-10-CM

## 2019-03-12 LAB — NOVEL CORONAVIRUS, NAA: SARS-CoV-2, NAA: NOT DETECTED

## 2019-03-25 ENCOUNTER — Ambulatory Visit: Payer: Self-pay

## 2019-06-10 ENCOUNTER — Ambulatory Visit: Payer: BC Managed Care – PPO | Attending: Internal Medicine

## 2019-06-10 ENCOUNTER — Other Ambulatory Visit: Payer: Self-pay

## 2019-06-10 DIAGNOSIS — Z20822 Contact with and (suspected) exposure to covid-19: Secondary | ICD-10-CM

## 2019-06-12 ENCOUNTER — Telehealth: Payer: Self-pay | Admitting: *Deleted

## 2019-06-12 LAB — NOVEL CORONAVIRUS, NAA: SARS-CoV-2, NAA: NOT DETECTED

## 2019-06-12 NOTE — Telephone Encounter (Signed)
Patient called for results ,still pending . 

## 2020-01-06 ENCOUNTER — Encounter: Payer: Self-pay | Admitting: Family Medicine

## 2020-01-07 NOTE — Telephone Encounter (Addendum)
Left message on vm per dpr asking pt to call back.  Needs to schedule an OV for anxiety.    Dr. Reece Agar, should this be a virtual visit?

## 2020-01-12 NOTE — Telephone Encounter (Addendum)
Yes please schedule virtually. Ok to place in 15 min slot.

## 2020-01-15 ENCOUNTER — Encounter: Payer: BLUE CROSS/BLUE SHIELD | Admitting: Obstetrics and Gynecology

## 2020-01-25 ENCOUNTER — Encounter: Payer: Self-pay | Admitting: Family Medicine

## 2020-01-25 ENCOUNTER — Telehealth (INDEPENDENT_AMBULATORY_CARE_PROVIDER_SITE_OTHER): Payer: BC Managed Care – PPO | Admitting: Family Medicine

## 2020-01-25 DIAGNOSIS — F410 Panic disorder [episodic paroxysmal anxiety] without agoraphobia: Secondary | ICD-10-CM

## 2020-01-25 MED ORDER — LORAZEPAM 0.5 MG PO TABS: 0.2500 mg | ORAL_TABLET | Freq: Two times a day (BID) | ORAL | 0 refills | Status: DC | PRN

## 2020-01-25 NOTE — Progress Notes (Signed)
Virtual visit completed through MyChart, a video enabled telemedicine application. Due to national recommendations of social distancing due to COVID-19, a virtual visit is felt to be most appropriate for this patient at this time. Reviewed limitations, risks, security and privacy concerns of performing a virtual visit and the availability of in person appointments. I also reviewed that there may be a patient responsible charge related to this service. The patient agreed to proceed.   Patient location: home Provider location: [place] [facility], office Persons participating in this virtual visit: patient, provider   If any vitals were documented, they were collected by patient at home unless specified below.    Ht 5\' 5"  (1.651 m)   Wt 148 lb (67.1 kg)   LMP 12/26/2019   BMI 24.63 kg/m    CC: discuss anxiety Subjective:    Patient ID: [id number], female    DOB: January 01, 1981, 39 y.o.   MRN: [id number]  HPI: Sherry Lara is a 39 y.o. female presenting on 01/25/2020 for Anxiety   See recent mychart message for details.  Noticing increasing anxiety over the past year - especially noted when she has to come to a doctor's appointment. This started during the pandemic. Increased trouble with focus as well as poor sleep. No manic or hypomanic symptoms. Some guilt over juggling work and family life.   Appt this week with GYN - feels she may not be able to complete this appointment. Has also had to cancel mammogram.   Several days a week gets episodes of anxiety.  She's tried to exercise to help - runs several times a week, yoga weekly.  No SI/HI.   Previously was on prozac as well as wellbutrin, celexa. Wellbutrin was likely most effective. She has also previously tried ativan PRN.   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 Trouble focusing at work but no significant anxiety attacks while at work.   She did complete Pfizer vaccine  series 08/2019     Relevant past medical, surgical, family and social history reviewed and updated as indicated. Interim medical history since our last visit reviewed. Allergies and medications reviewed and updated. Outpatient Medications Prior to Visit  Medication Sig Dispense Refill  . nystatin-triamcinolone (MYCOLOG II) cream Apply 1 application topically 2 (two) times daily. 30 g 6  . triamcinolone cream (KENALOG) 0.1 % Apply 1 application topically 2 (two) times daily as needed. 30 g 0  . spironolactone (ALDACTONE) 50 MG tablet Take 50 mg by mouth daily. (Patient not taking: Reported on 01/25/2020)     No facility-administered medications prior to visit.     Per HPI unless specifically indicated in ROS section below Review of Systems Objective:  Ht 5\' 5"  (1.651 m)   Wt 148 lb (67.1 kg)   LMP 12/26/2019   BMI 24.63 kg/m   Wt Readings from Last 3 Encounters:  01/25/20 148 lb (67.1 kg)  01/14/19 154 lb (69.9 kg)  07/23/18 147 lb 8 oz (66.9 kg)       Physical exam: Gen: alert, NAD, not ill appearing Pulm: speaks in complete sentences without increased work of breathing Psych: normal thought content, tearful with discussion of recent stressors     Results for orders placed or performed in visit on 06/10/19  Novel Coronavirus, NAA ([facility])   Specimen: Nasopharyngeal(NP) swabs in vial transport medium   NASOPHARYNGE  TESTING  Result Value Ref Range   SARS-CoV-2, NAA Not Detected  Not Detected   Depression screen [facility] 2/9 01/25/2020 05/24/2017  Decreased Interest 0 1  Down, Depressed, Hopeless 0 1  PHQ - 2 Score 0 2  Altered sleeping 3 2  Tired, decreased energy 1 1  Change in appetite 0 0  Feeling bad or failure about yourself  1 0  Trouble concentrating 3 2  Moving slowly or fidgety/restless 0 0  Suicidal thoughts 0 0  PHQ-9 Score 8 7    GAD 7 : Generalized Anxiety Score 01/25/2020 05/24/2017  Nervous, Anxious, on Edge 2 1  Control/stop worrying 2 0  Worry too much -  different things 2 1  Trouble relaxing 2 1  Restless 2 0  Easily annoyed or irritable 0 1  Afraid - awful might happen 0 0  Total GAD 7 Score 10 4   Assessment & Plan:   Problem List Items Addressed This Visit    Anxiety attack    Anxiety with attacks acutely worse this past year, predominantly manifesting around healthcare appointments but also trouble with focus and insomnia also affecting home/work. Pandemic likely contributing but does have h/o anxiety in the past.  Encouraged continuing healthy stress relieving strategies. Will Rx benzo ativan 0.5mg  1/2-1 tab BID PRN, reviewed sedation precautions as well as habit forming nature of medication. Discussed possibly restarting wellbutrin if needed vs trazodone for sleep. Update with effect.       Relevant Medications   LORazepam (ATIVAN) 0.5 MG tablet       Meds ordered this encounter  Medications  . LORazepam (ATIVAN) 0.5 MG tablet    Sig: Take 0.5-1 tablets (0.25-0.5 mg total) by mouth 2 (two) times daily as needed for anxiety.    Dispense:  30 tablet    Refill:  0   No orders of the defined types were placed in this encounter.   I discussed the assessment and treatment plan with the patient. The patient was provided an opportunity to ask questions and all were answered. The patient agreed with the plan and demonstrated an understanding of the instructions. The patient was advised to call back or seek an in-person evaluation if the symptoms worsen or if the condition fails to improve as anticipated.  Follow up plan: No follow-ups on file.  Eustaquio Boyden, MD

## 2020-01-25 NOTE — Assessment & Plan Note (Addendum)
Anxiety with attacks acutely worse this past year, predominantly manifesting around healthcare appointments but also trouble with focus and insomnia also affecting home/work. Pandemic likely contributing but does have h/o anxiety in the past.  Encouraged continuing healthy stress relieving strategies. Will Rx benzo ativan 0.5mg  1/2-1 tab BID PRN, reviewed sedation precautions as well as habit forming nature of medication. Discussed possibly restarting wellbutrin if needed vs trazodone for sleep. Update with effect.

## 2020-01-27 ENCOUNTER — Encounter: Payer: BC Managed Care – PPO | Admitting: Obstetrics and Gynecology

## 2020-01-27 ENCOUNTER — Telehealth: Payer: Self-pay | Admitting: Obstetrics and Gynecology

## 2020-01-27 ENCOUNTER — Telehealth: Payer: Self-pay

## 2020-01-27 NOTE — Telephone Encounter (Signed)
Patient came in late for her appointment (11 minutes late), called back to nurse and nurse informed me that Dr. Valentino Saxon was over at the hospital and that it was unknown what time the provider would be back that we would need to inform patient and let her decide whether she would like to wait or not. Patient stated she would like to reschedule however she would call back once she had her calender.  30 minutes later her husband (who accompanied patient to her appointment) called in and informed me that his wife suffers from "white coat syndrome" and that they were on the grounds it just took him 20 minutes to coach his wife into the building. Patient's husband states that her PCP has given her medication to help with this and today she was medicated when she came to her appointment however it wasn't of any help. Patient's husband states that his wife seems to be fine when she is placed in a room but working up the courage to come in the building and even sitting in the waiting room is overwhelming for his wife. He would like this documented and it would be appreciated if Dr. Valentino Saxon could work with this patient and her accommodations at her next visit. Informed Mr. [name] that I would send a message to her provider. Patient stated he appreciated my help.

## 2020-01-27 NOTE — Telephone Encounter (Signed)
Pt called no answer unable to leave message due to vm being full. Was calling to check on pt after being informed by front staff that when pt arrived 11 minutes late and had to reschedule she started crying. Will send pt a mychart message.

## 2020-01-28 NOTE — Telephone Encounter (Signed)
Please see phone encounter from yesterday.

## 2020-01-28 NOTE — Telephone Encounter (Signed)
See if the patient would prefer to reschedule her appointment and wait in her car until called, and then we could work her up in the exam room (except her weight which would need to be done at the nursing station), to avoid waiting in the waiting area, and then have time to relax in the exam room before her appointment.

## 2020-02-03 NOTE — Telephone Encounter (Signed)
Pt called no answer LM via VM to call the office to schedule an appointment for her annual exam and to discuss information given by [facility] concerning that she could sit in her car or come right into the office and go to an exam room and that her husband can come with her if she feel that will help her.

## 2020-03-11 NOTE — Telephone Encounter (Signed)
Letter sent to pt via mychart and letter mailed with information given by [facility] concerning her appt.

## 2020-07-07 ENCOUNTER — Encounter: Payer: BC Managed Care – PPO | Admitting: Obstetrics and Gynecology

## 2020-07-14 ENCOUNTER — Encounter: Payer: BLUE CROSS/BLUE SHIELD | Admitting: Obstetrics and Gynecology

## 2020-07-19 ENCOUNTER — Other Ambulatory Visit: Payer: Self-pay

## 2020-07-19 ENCOUNTER — Encounter: Payer: Self-pay | Admitting: Family Medicine

## 2020-07-19 ENCOUNTER — Ambulatory Visit (INDEPENDENT_AMBULATORY_CARE_PROVIDER_SITE_OTHER): Payer: BC Managed Care – PPO | Admitting: Family Medicine

## 2020-07-19 VITALS — BP 120/76 | HR 80 | Temp 97.6°F | Ht 65.0 in | Wt 157.0 lb

## 2020-07-19 DIAGNOSIS — F411 Generalized anxiety disorder: Secondary | ICD-10-CM | POA: Insufficient documentation

## 2020-07-19 DIAGNOSIS — F410 Panic disorder [episodic paroxysmal anxiety] without agoraphobia: Secondary | ICD-10-CM

## 2020-07-19 LAB — CBC WITH DIFFERENTIAL/PLATELET
Basophils Absolute: 0 10*3/uL (ref 0.0–0.1)
Basophils Relative: 0.4 % (ref 0.0–3.0)
Eosinophils Absolute: 0 10*3/uL (ref 0.0–0.7)
Eosinophils Relative: 0.1 % (ref 0.0–5.0)
HCT: 38.7 % (ref 36.0–46.0)
Hemoglobin: 12.8 g/dL (ref 12.0–15.0)
Lymphocytes Relative: 19.6 % (ref 12.0–46.0)
Lymphs Abs: 1.4 10*3/uL (ref 0.7–4.0)
MCHC: 33 g/dL (ref 30.0–36.0)
MCV: 84.6 fl (ref 78.0–100.0)
Monocytes Absolute: 0.5 10*3/uL (ref 0.1–1.0)
Monocytes Relative: 6.5 % (ref 3.0–12.0)
Neutro Abs: 5.3 10*3/uL (ref 1.4–7.7)
Neutrophils Relative %: 73.4 % (ref 43.0–77.0)
Platelets: 344 10*3/uL (ref 150.0–400.0)
RBC: 4.57 Mil/uL (ref 3.87–5.11)
RDW: 14.3 % (ref 11.5–15.5)
WBC: 7.2 10*3/uL (ref 4.0–10.5)

## 2020-07-19 LAB — BASIC METABOLIC PANEL
BUN: 11 mg/dL (ref 6–23)
CO2: 31 mEq/L (ref 19–32)
Calcium: 9.9 mg/dL (ref 8.4–10.5)
Chloride: 102 mEq/L (ref 96–112)
Creatinine, Ser: 0.73 mg/dL (ref 0.40–1.20)
GFR: 102.48 mL/min (ref >60.00)
Glucose, Bld: 89 mg/dL (ref 70–99)
Potassium: 3.9 mEq/L (ref 3.5–5.1)
Sodium: 140 mEq/L (ref 135–145)

## 2020-07-19 LAB — TSH: TSH: 0.86 u[IU]/mL (ref 0.35–4.50)

## 2020-07-19 MED ORDER — LORAZEPAM 0.5 MG PO TABS: 0.5000 mg | ORAL_TABLET | Freq: Two times a day (BID) | ORAL | 0 refills | Status: DC | PRN

## 2020-07-19 MED ORDER — BUPROPION HCL ER (SR) 100 MG PO TB12: 100.0000 mg | ORAL_TABLET | Freq: Every day | ORAL | 6 refills | Status: DC

## 2020-07-19 NOTE — Progress Notes (Signed)
Patient ID: Sherry Lara, female    DOB: Feb 22, 1981, 40 y.o.   MRN: [id number]  This visit was conducted in person.  BP 120/76 (BP Location: Left Arm, Patient Position: Sitting, Cuff Size: Normal)   Pulse 80   Temp 97.6 F (36.4 C) (Temporal)   Ht 5\' 5"  (1.651 m)   Wt 157 lb (71.2 kg)   LMP 07/06/2020   SpO2 99%   BMI 26.13 kg/m    CC: mood f/u Subjective:   HPI: Sherry Lara is a 40 y.o. female presenting on 07/19/2020 for Follow-up (Here for mood f/u.)   Increasing anxiety over the past 18 months surrounding office visits to doctor.  Has been unable to make OBGYN appointments.  Increased anxiety at work and at home as well - since pandemic. Spending significant amount of time trying to manage how she's feeling.  Interested in daily medication as well as counseling.   Several days a week gets episodes of anxiety.  Restarted exercise routine - yoga weekly, walking regularly.  No SI/HI.  Previously was on prozac as well as celexa and wellbutrin. Wellbutrin was likely most effective. Using lorazepam PRN with benefit. Notes easy awakenings at night time.  No h/o seizures.   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      Relevant past medical, surgical, family and social history reviewed and updated as indicated. Interim medical history since our last visit reviewed. Allergies and medications reviewed and updated. Outpatient Medications Prior to Visit  Medication Sig Dispense Refill  . LORazepam (ATIVAN) 0.5 MG tablet Take 0.5-1 tablets (0.25-0.5 mg total) by mouth 2 (two) times daily as needed for anxiety. 30 tablet 0  . nystatin-triamcinolone (MYCOLOG II) cream Apply 1 application topically 2 (two) times daily. 30 g 6  . spironolactone (ALDACTONE) 50 MG tablet Take 50 mg by mouth daily. (Patient not taking: Reported on 01/25/2020)    . triamcinolone cream (KENALOG) 0.1 % Apply 1 application topically 2 (two) times daily as  needed. 30 g 0   No facility-administered medications prior to visit.     Per HPI unless specifically indicated in ROS section below Review of Systems Objective:  BP 120/76 (BP Location: Left Arm, Patient Position: Sitting, Cuff Size: Normal)   Pulse 80   Temp 97.6 F (36.4 C) (Temporal)   Ht 5\' 5"  (1.651 m)   Wt 157 lb (71.2 kg)   LMP 07/06/2020   SpO2 99%   BMI 26.13 kg/m   Wt Readings from Last 3 Encounters:  07/19/20 157 lb (71.2 kg)  01/25/20 148 lb (67.1 kg)  01/14/19 154 lb (69.9 kg)      Physical Exam Vitals and nursing note reviewed.  Constitutional:      Appearance: Normal appearance. She is not ill-appearing.  Eyes:     Extraocular Movements: Extraocular movements intact.     Pupils: Pupils are equal, round, and reactive to light.  Neck:     Thyroid: No thyroid mass or thyromegaly.  Cardiovascular:     Rate and Rhythm: Normal rate and regular rhythm.     Pulses: Normal pulses.     Heart sounds: Normal heart sounds. No murmur heard.   Pulmonary:     Effort: Pulmonary effort is normal. No respiratory distress.     Breath sounds: Normal breath sounds. No wheezing, rhonchi or rales.  Musculoskeletal:     Right lower leg: No edema.     Left  lower leg: No edema.  Neurological:     Mental Status: She is alert.  Psychiatric:        Mood and Affect: Mood normal.        Behavior: Behavior normal.       Depression screen [facility] 2/9 07/19/2020 01/25/2020 05/24/2017  Decreased Interest 0 0 1  Down, Depressed, Hopeless 1 0 1  PHQ - 2 Score 1 0 2  Altered sleeping 2 3 2   Tired, decreased energy 0 1 1  Change in appetite 0 0 0  Feeling bad or failure about yourself  1 1 0  Trouble concentrating 3 3 2   Moving slowly or fidgety/restless 2 0 0  Suicidal thoughts 0 0 0  PHQ-9 Score 9 8 7     GAD 7 : Generalized Anxiety Score 07/19/2020 01/25/2020 05/24/2017  Nervous, Anxious, on Edge 3 2 1   Control/stop worrying 3 2 0  Worry too much - different things 3 2 1    Trouble relaxing 3 2 1   Restless 3 2 0  Easily annoyed or irritable 1 0 1  Afraid - awful might happen 2 0 0  Total GAD 7 Score 18 10 4    Assessment & Plan:  This visit occurred during the SARS-CoV-2 public health emergency.  Safety protocols were in place, including screening questions prior to the visit, additional usage of staff PPE, and extensive cleaning of exam room while observing appropriate contact time as indicated for disinfecting solutions.   Problem List Items Addressed This Visit    Panic attacks - Primary    Describes recurrent panic attacks precipitated by healthcare visits.  Start wellbutrin SR 100mg  daily, reviewed routine precaution with antidepressant commencement. Continue lorazepam PRN. She will also call to establish with counselor.       Relevant Medications   LORazepam (ATIVAN) 0.5 MG tablet   buPROPion (WELLBUTRIN SR) 100 MG 12 hr tablet   GAD (generalized anxiety disorder)    Best effect to wellbutrin previously.  Restart at 100mg  SR once daily - reviewing possible increased restless energy with this activating antidepressant. She will keep me updated with how she's doing through [place].       Relevant Medications   LORazepam (ATIVAN) 0.5 MG tablet   buPROPion (WELLBUTRIN SR) 100 MG 12 hr tablet   Other Relevant Orders   TSH   CBC with Differential/Platelet   Basic metabolic panel       Meds ordered this encounter  Medications  . LORazepam (ATIVAN) 0.5 MG tablet    Sig: Take 1 tablet (0.5 mg total) by mouth 2 (two) times daily as needed for anxiety.    Dispense:  30 tablet    Refill:  0  . buPROPion (WELLBUTRIN SR) 100 MG 12 hr tablet    Sig: Take 1 tablet (100 mg total) by mouth daily.    Dispense:  30 tablet    Refill:  6   Orders Placed This Encounter  Procedures  . TSH  . CBC with Differential/Platelet  . Basic metabolic panel    Patient instructions: I agree let's start welbutrin SR 100mg  daily in the mornings. Lorazepam  refilled.  Schedule appointment with counselor.  Keep me updated with how you're doing.  Watch for restless energy on wellbutrin, if that happens let me know we may switch medicines.   Follow up plan: Return if symptoms worsen or fail to improve.  03/26/2020, MD

## 2020-07-19 NOTE — Assessment & Plan Note (Signed)
Describes recurrent panic attacks precipitated by healthcare visits.  Start wellbutrin SR 100mg  daily, reviewed routine precaution with antidepressant commencement. Continue lorazepam PRN. She will also call to establish with counselor.

## 2020-07-19 NOTE — Assessment & Plan Note (Signed)
Best effect to wellbutrin previously.  Restart at 100mg  SR once daily - reviewing possible increased restless energy with this activating antidepressant. She will keep me updated with how she's doing through [place].

## 2020-07-19 NOTE — Patient Instructions (Addendum)
I agree let's start welbutrin SR 100mg  daily in the mornings. Lorazepam refilled.  Schedule appointment with counselor.  Keep me updated with how you're doing.  Watch for restless energy on wellbutrin, if that happens let me know we may switch medicines.   Panic Attack A panic attack is a sudden episode of severe anxiety, fear, or discomfort that causes physical and emotional symptoms. The attack may be in response to something frightening, or it may occur for no known reason. Symptoms of a panic attack can be similar to symptoms of a heart attack or stroke. It is important to see your health care provider when you have a panic attack so that these conditions can be ruled out. A panic attack is a symptom of another condition. Most panic attacks go away with treatment of the underlying problem. If you have panic attacks often, you may have a condition called panic disorder. What are the causes? A panic attack may be caused by:  An extreme, life-threatening situation, such as a war or natural disaster.  An anxiety disorder, such as post-traumatic stress disorder.  Depression.  Certain medical conditions, including heart problems, neurological conditions, and infections.  Certain over-the-counter and prescription medicines.  Illegal drugs that increase heart rate and blood pressure, such as methamphetamine.  Alcohol.  Supplements that increase anxiety.  Panic disorder. What increases the risk? You are more likely to develop this condition if:  You have an anxiety disorder.  You have another mental health condition.  You take certain medicines.  You use alcohol, illegal drugs, or other substances.  You are under extreme stress.  A life event is causing increased feelings of anxiety and depression. What are the signs or symptoms? A panic attack starts suddenly, usually lasts about 20 minutes, and occurs with one or more of the following:  A pounding heart.  A feeling that  your heart is beating irregularly or faster than normal (palpitations).  Sweating.  Trembling or shaking.  Shortness of breath or feeling smothered.  Feeling choked.  Chest pain or discomfort.  Nausea or a strange feeling in your stomach.  Dizziness, feeling lightheaded, or feeling like you might faint.  Chills or hot flashes.  Numbness or tingling in your lips, hands, or feet.  Feeling confused, or feeling that you are not yourself.  Fear of losing control or being emotionally unstable.  Fear of dying. How is this diagnosed? A panic attack is diagnosed with an assessment by your health care provider. During the assessment your health care provider will ask questions about:  Your history of anxiety, depression, and panic attacks.  Your medical history.  Whether you drink alcohol, use illegal drugs, take supplements, or take medicines. Be honest about your substance use. Your health care provider may also:  Order blood tests or other kinds of tests to rule out serious medical conditions.  Refer you to a mental health professional for further evaluation.   How is this treated? Treatment depends on the cause of the panic attack:  If the cause is a medical problem, your health care provider will either treat that problem or refer you to a specialist.  If the cause is emotional, you may be given anti-anxiety medicines or referred to a counselor. These medicines may reduce how often attacks happen, reduce how severe the attacks are, and lower anxiety.  If the cause is a medicine, your health care provider may tell you to stop the medicine, change your dose, or take a different medicine.  If the cause is a drug, treatment may involve letting the drug wear off and taking medicine to help the drug leave your body or to counteract its effects. Attacks caused by drug abuse may continue even if you stop using the drug. Follow these instructions at home:  Take over-the-counter  and prescription medicines only as told by your health care provider.  If you feel anxious, limit your caffeine intake.  Take good care of your physical and mental health by: ? Eating a balanced diet that includes plenty of fresh fruits and vegetables, whole grains, lean meats, and low-fat dairy. ? Getting plenty of rest. Try to get 7-8 hours of uninterrupted sleep each night. ? Exercising regularly. Try to get 30 minutes of physical activity at least 5 days a week. ? Not smoking. Talk to your health care provider if you need help quitting. ? Limiting alcohol intake to no more than 1 drink a day for nonpregnant women and 2 drinks a day for men. One drink equals 12 oz of beer, 5 oz of wine, or 1 oz of hard liquor.  Keep all follow-up visits as told by your health care provider. This is important. Panic attacks may have underlying physical or emotional problems that take time to accurately diagnose. Contact a health care provider if:  Your symptoms do not improve, or they get worse.  You are not able to take your medicine as prescribed because of side effects. Get help right away if:  You have serious thoughts about hurting yourself or others.  You have symptoms of a panic attack. Do not drive yourself to the hospital. Have someone else drive you or call an ambulance. If you ever feel like you may hurt yourself or others, or you have thoughts about taking your own life, get help right away. You can go to your nearest emergency department or call:  Your local emergency services (911 in the [place]  A suicide crisis helpline, such as the National Suicide Prevention Lifeline at 270 286 9134. This is open 24 hours a day. Summary  A panic attack is a sign of a serious health or mental health condition. Get help right away. Do not drive yourself to the hospital. Have someone else drive you or call an ambulance.  Always see a health care provider to have the reasons for the panic attack  correctly diagnosed.  If your panic attack was caused by a physical problem, follow your health care provider's suggestions for medicine, referral to a specialist, and lifestyle changes.  If your panic attack was caused by an emotional problem, follow through with counseling from a qualified mental health specialist.  If you feel like you may hurt yourself or others, call 911 and get help right away. This information is not intended to replace advice given to you by your health care provider. Make sure you discuss any questions you have with your health care provider. Document Revised: 12/10/2019 Document Reviewed: 12/10/2019 Elsevier Patient Education  2021 [employer].

## 2020-09-13 ENCOUNTER — Telehealth: Payer: Self-pay | Admitting: *Deleted

## 2020-09-13 MED ORDER — BUPROPION HCL ER (SR) 100 MG PO TB12: 100.0000 mg | ORAL_TABLET | Freq: Every day | ORAL | 3 refills | Status: DC

## 2020-09-13 NOTE — Telephone Encounter (Signed)
Patient called stating that her insurance is now requiring that she get a 90 day supply on her medications and has to use [facility]. Patient stated that she has only 2 Bupropion pills left and needs a 90 day supply sent in. Patient stated that the medication is working and she feels much better. Patient stated that the anxiety and concentration has greatly improved. [facility] [address]

## 2020-09-13 NOTE — Telephone Encounter (Signed)
Glad to hear she's doing better. i've sent 3 mo supply to pharmacy.

## 2020-10-11 ENCOUNTER — Encounter: Payer: Self-pay | Admitting: Family Medicine

## 2020-10-11 MED ORDER — BUPROPION HCL ER (SR) 150 MG PO TB12: 150.0000 mg | ORAL_TABLET | Freq: Every day | ORAL | 1 refills | Status: DC

## 2021-01-16 ENCOUNTER — Ambulatory Visit (INDEPENDENT_AMBULATORY_CARE_PROVIDER_SITE_OTHER): Payer: BLUE CROSS/BLUE SHIELD | Admitting: Certified Nurse Midwife

## 2021-01-16 ENCOUNTER — Other Ambulatory Visit (HOSPITAL_COMMUNITY)
Admission: RE | Admit: 2021-01-16 | Discharge: 2021-01-16 | Disposition: A | Discharge status: Home / Self Care | Payer: BC Managed Care – PPO | Source: Ambulatory Visit | Attending: Obstetrics and Gynecology | Admitting: Obstetrics and Gynecology

## 2021-01-16 ENCOUNTER — Encounter: Payer: Self-pay | Admitting: Certified Nurse Midwife

## 2021-01-16 ENCOUNTER — Other Ambulatory Visit: Payer: Self-pay

## 2021-01-16 VITALS — BP 126/84 | HR 65 | Ht 64.0 in | Wt 151.2 lb

## 2021-01-16 DIAGNOSIS — Z01419 Encounter for gynecological examination (general) (routine) without abnormal findings: Secondary | ICD-10-CM

## 2021-01-16 DIAGNOSIS — N898 Other specified noninflammatory disorders of vagina: Secondary | ICD-10-CM

## 2021-01-16 DIAGNOSIS — Z1231 Encounter for screening mammogram for malignant neoplasm of breast: Secondary | ICD-10-CM

## 2021-01-16 NOTE — Progress Notes (Signed)
GYNECOLOGY ANNUAL PREVENTATIVE CARE ENCOUNTER NOTE  History:     Sherry Lara is a 40 y.o. [phone] female here for a routine annual gynecologic exam.  Current complaints: vaginal itching.   Denies abnormal vaginal bleeding, discharge, pelvic pain, problems with intercourse or other gynecologic concerns.     Social Relationship: married  Living: spouse and 3 children (14/13/9) Work: Conservator, museum/gallery  Exercise: rune/yoga 4 x wk  Smoke/Alcohol/drug use: alcohol 2 x wkly, denies smoking and drug use   Gynecologic History No LMP recorded (lmp unknown). Contraception: vasectomy Last Pap: 01/11/2017. Results were: normal with negative HPV Last mammogram: order.   Obstetric History OB History  Gravida Para Term Preterm AB Living  4 2 2   1 3   SAB IAB Ectopic Multiple Live Births  1       3    # Outcome Date GA Lbr Len/2nd Weight Sex Delivery Anes PTL Lv  4 SAB           3 Term      CS-LTranv   LIV  2 Term      CS-LTranv   LIV  1 Gravida      CS-LTranv   LIV    Past Medical History:  Diagnosis Date   Acne    Anxiety    Asthma    Depression    GDM (gestational diabetes mellitus)    History of asthma    childhood   History of chicken pox    History of depression    and anxiety; prior on wellbutrin/celexa   Hx of migraines    improved after pregnancies   Migraines    Vaginal irritation     Past Surgical History:  Procedure Laterality Date   ADENOIDECTOMY     CESAREAN SECTION  2008,2009,2013   CHOLECYSTECTOMY  2009   TONSILLECTOMY  ~1992   WISDOM TOOTH EXTRACTION      Current Outpatient Medications on File Prior to Visit  Medication Sig Dispense Refill   buPROPion (WELLBUTRIN SR) 150 MG 12 hr tablet Take 1 tablet (150 mg total) by mouth daily. 90 tablet 1   LORazepam (ATIVAN) 0.5 MG tablet Take 1 tablet (0.5 mg total) by mouth 2 (two) times daily as needed for anxiety. 30 tablet 0   No current facility-administered medications on file  prior to visit.    Allergies  Allergen Reactions   Vicodin [Hydrocodone-Acetaminophen] Nausea And Vomiting   Codeine Rash   Penicillins Rash    Social History:  reports that she has never smoked. She has never used smokeless tobacco. She reports current alcohol use. She reports that she does not use drugs.  Family History  Problem Relation Age of Onset   Hypertension Father    Hyperlipidemia Father        possibly   CAD Neg Hx    Stroke Neg Hx    Diabetes Neg Hx    Cancer Neg Hx     The following portions of the patient's history were reviewed and updated as appropriate: allergies, current medications, past family history, past medical history, past social history, past surgical history and problem list.  Review of Systems Pertinent items noted in HPI and remainder of comprehensive ROS otherwise negative.  Physical Exam:  BP (!) 174/99   Pulse 65   Ht 5\' 4"  (1.626 m)   Wt 151 lb 3.2 oz (68.6 kg)   LMP  (LMP Unknown)   BMI 25.95 kg/m  CONSTITUTIONAL: Well-developed,  well-nourished female in no acute distress.  HENT:  Normocephalic, atraumatic, External right and left ear normal. Oropharynx is clear and moist EYES: Conjunctivae and EOM are normal. Pupils are equal, round, and reactive to light. No scleral icterus.  NECK: Normal range of motion, supple, no masses.  Normal thyroid.  SKIN: Skin is warm and dry. No rash noted. Not diaphoretic. No erythema. No pallor. MUSCULOSKELETAL: Normal range of motion. No tenderness.  No cyanosis, clubbing, or edema.  2+ distal pulses. NEUROLOGIC: Alert and oriented to person, place, and time. Normal reflexes, muscle tone coordination.  PSYCHIATRIC: Normal mood and affect. Normal behavior. Normal judgment and thought content. CARDIOVASCULAR: Normal heart rate noted, regular rhythm RESPIRATORY: Clear to auscultation bilaterally. Effort and breath sounds normal, no problems with respiration noted. BREASTS: Symmetric in size. No masses,  tenderness, skin changes, nipple drainage, or lymphadenopathy bilaterally.  ABDOMEN: Soft, no distention noted.  No tenderness, rebound or guarding.  PELVIC: Normal appearing external genitalia and urethral meatus; normal appearing vaginal mucosa and cervix.  No abnormal discharge noted.  Pap smear not due. Swab collected.  Normal uterine size, no other palpable masses, no uterine or adnexal tenderness.  .   Assessment and Plan:    1. Encounter for annual routine gynecological examination    Pap: not due  Mammogram :ordered, pt very anxious- states she doesn't think she will be able to due it due to anxiety (tearful) Labs: declines blood work, vaginal swab collected  Refills: none  Referral: none  Routine preventative health maintenance measures emphasized. Please refer to After Visit Summary for other counseling recommendations.      Doreene Burke, CNM [facility],  [facility]

## 2021-01-16 NOTE — Patient Instructions (Signed)
Preventive Care 68-41 Years Old, Female Preventive care refers to lifestyle choices and visits with your health care provider that can promote health and wellness. This includes: A yearly physical exam. This is also called an annual wellness visit. Regular dental and eye exams. Immunizations. Screening for certain conditions. Healthy lifestyle choices, such as: Eating a healthy diet. Getting regular exercise. Not using drugs or products that contain nicotine and tobacco. Limiting alcohol use. What can I expect for my preventive care visit? Physical exam Your health care provider will check your: Height and weight. These may be used to calculate your BMI (body mass index). BMI is a measurement that tells if you are at a healthy weight. Heart rate and blood pressure. Body temperature. Skin for abnormal spots. Counseling Your health care provider may ask you questions about your: Past medical problems. Family's medical history. Alcohol, tobacco, and drug use. Emotional well-being. Home life and relationship well-being. Sexual activity. Diet, exercise, and sleep habits. Work and work Statistician. Access to firearms. Method of birth control. Menstrual cycle. Pregnancy history. What immunizations do I need?  Vaccines are usually given at various ages, according to a schedule. Your health care provider will recommend vaccines for you based on your age, medicalhistory, and lifestyle or other factors, such as travel or where you work. What tests do I need? Blood tests Lipid and cholesterol levels. These may be checked every 5 years, or more often if you are over 37 years old. Hepatitis C test. Hepatitis B test. Screening Lung cancer screening. You may have this screening every year starting at age 30 if you have a 30-pack-year history of smoking and currently smoke or have quit within the past 15 years. Colorectal cancer screening. All adults should have this screening starting at  age 23 and continuing until age 3. Your health care provider may recommend screening at age 88 if you are at increased risk. You will have tests every 1-10 years, depending on your results and the type of screening test. Diabetes screening. This is done by checking your blood sugar (glucose) after you have not eaten for a while (fasting). You may have this done every 1-3 years. Mammogram. This may be done every 1-2 years. Talk with your health care provider about when you should start having regular mammograms. This may depend on whether you have a family history of breast cancer. BRCA-related cancer screening. This may be done if you have a family history of breast, ovarian, tubal, or peritoneal cancers. Pelvic exam and Pap test. This may be done every 3 years starting at age 79. Starting at age 54, this may be done every 5 years if you have a Pap test in combination with an HPV test. Other tests STD (sexually transmitted disease) testing, if you are at risk. Bone density scan. This is done to screen for osteoporosis. You may have this scan if you are at high risk for osteoporosis. Talk with your health care provider about your test results, treatment options,and if necessary, the need for more tests. Follow these instructions at home: Eating and drinking  Eat a diet that includes fresh fruits and vegetables, whole grains, lean protein, and low-fat dairy products. Take vitamin and mineral supplements as recommended by your health care provider. Do not drink alcohol if: Your health care provider tells you not to drink. You are pregnant, may be pregnant, or are planning to become pregnant. If you drink alcohol: Limit how much you have to 0-1 drink a day. Be aware  of how much alcohol is in your drink. In the [place] one drink equals one 12 oz bottle of beer (355 mL), one 5 oz glass of wine (148 mL), or one 1 oz glass of hard liquor (44 mL).  Lifestyle Take daily care of your teeth and  gums. Brush your teeth every morning and night with fluoride toothpaste. Floss one time each day. Stay active. Exercise for at least 30 minutes 5 or more days each week. Do not use any products that contain nicotine or tobacco, such as cigarettes, e-cigarettes, and chewing tobacco. If you need help quitting, ask your health care provider. Do not use drugs. If you are sexually active, practice safe sex. Use a condom or other form of protection to prevent STIs (sexually transmitted infections). If you do not wish to become pregnant, use a form of birth control. If you plan to become pregnant, see your health care provider for a prepregnancy visit. If told by your health care provider, take low-dose aspirin daily starting at age 29. Find healthy ways to cope with stress, such as: Meditation, yoga, or listening to music. Journaling. Talking to a trusted person. Spending time with friends and family. Safety Always wear your seat belt while driving or riding in a vehicle. Do not drive: If you have been drinking alcohol. Do not ride with someone who has been drinking. When you are tired or distracted. While texting. Wear a helmet and other protective equipment during sports activities. If you have firearms in your house, make sure you follow all gun safety procedures. What's next? Visit your health care provider once a year for an annual wellness visit. Ask your health care provider how often you should have your eyes and teeth checked. Stay up to date on all vaccines. This information is not intended to replace advice given to you by your health care provider. Make sure you discuss any questions you have with your healthcare provider. Document Revised: 03/15/2020 Document Reviewed: 02/20/2018 Elsevier Patient Education  2022 [employer].

## 2021-01-17 ENCOUNTER — Other Ambulatory Visit: Payer: Self-pay | Admitting: Certified Nurse Midwife

## 2021-01-17 LAB — CERVICOVAGINAL ANCILLARY ONLY
Bacterial Vaginitis (gardnerella): POSITIVE — AB
Candida Glabrata: NEGATIVE
Candida Vaginitis: NEGATIVE
Comment: NEGATIVE
Comment: NEGATIVE
Comment: NEGATIVE

## 2021-01-17 MED ORDER — METRONIDAZOLE 500 MG PO TABS: 500.0000 mg | ORAL_TABLET | Freq: Two times a day (BID) | ORAL | 0 refills | Status: AC

## 2021-01-17 NOTE — Progress Notes (Signed)
Pt swab positive for BV, orders placed for treatment.   Doreene Burke, CNM

## 2021-02-07 ENCOUNTER — Telehealth: Payer: BC Managed Care – PPO | Admitting: Family Medicine

## 2021-02-07 DIAGNOSIS — R197 Diarrhea, unspecified: Secondary | ICD-10-CM | POA: Diagnosis not present

## 2021-02-07 NOTE — Progress Notes (Signed)
Sherry Lara are scheduled for a virtual visit with your provider today.    Just as we do with appointments in the office, we must obtain your consent to participate.  Your consent will be active for this visit and any virtual visit you may have with one of our providers in the next 365 days.    If you have a MyChart account, I can also send a copy of this consent to you electronically.  All virtual visits are billed to your insurance company just like a traditional visit in the office.  As this is a virtual visit, video technology does not allow for your provider to perform a traditional examination.  This may limit your provider's ability to fully assess your condition.  If your provider identifies any concerns that need to be evaluated in person or the need to arrange testing such as labs, EKG, etc, we will make arrangements to do so.    Although advances in technology are sophisticated, we cannot ensure that it will always work on either your end or our end.  If the connection with a video visit is poor, we may have to switch to a telephone visit.  With either a video or telephone visit, we are not always able to ensure that we have a secure connection.   I need to obtain your verbal consent now.   Are you willing to proceed with your visit today?   Sherry Lara has provided verbal consent on 02/07/2021 for a virtual visit (video or telephone).   Freddy Finner, NP 02/07/2021  1:56 PM   Date:  02/07/2021   ID:  [name], DOB [birth year], MRN [id number]  Patient Location: Home Provider Location: Home Office   Participants: Patient and Provider for Visit and Wrap up  Method of visit: Video  Location of Patient: Home Location of Provider: Home Office Consent was obtain for visit over the video. Services rendered by provider: Visit was performed via video  A video enabled telemedicine application was used and I verified that I am speaking with the  correct person using two identifiers.  PCP:  Eustaquio Boyden, MD   Chief Complaint:  diarrhea  History of Present Illness:    Sherry Lara is a 40 y.o. female with history as stated below. Presents video telehealth for an acute care visit diarrhea. Friday upset tummy, Saturday diarrhea started- going on 4 days. Covid negative No other sick contacts with this. Reports pain in abdomen lower area. No diet changes noted. No eating out of food poison risk. No travel to high risk areas Eating normally- had sausage quiche without issue. Made cake Thursday night- licked the beaters. Started having nausea today. Mild bleeding from hemorrhoid. No triggers identified.    Tried tums, and pepto-limited help.   Past Medical, Surgical, Social History, Allergies, and Medications have been Reviewed.  Past Medical History:  Diagnosis Date   Acne    Anxiety    Asthma    Depression    GDM (gestational diabetes mellitus)    History of asthma    childhood   History of chicken pox    History of depression    and anxiety; prior on wellbutrin/celexa   Hx of migraines    improved after pregnancies   Migraines    Vaginal irritation     No outpatient medications have been marked as taking for the 02/07/21 encounter (Appointment) with [facility] PROVIDER.     Allergies:  Vicodin [hydrocodone-acetaminophen], Codeine, and Penicillins   ROS See HPI for history of present illness.  Physical Exam Constitutional:      Appearance: Normal appearance.  HENT:     Head: Normocephalic.     Nose: Nose normal.  Eyes:     Conjunctiva/sclera: Conjunctivae normal.  Pulmonary:     Effort: Pulmonary effort is normal.  Musculoskeletal:        General: Normal range of motion.     Cervical back: Normal range of motion.  Skin:    Coloration: Skin is not jaundiced.  Neurological:     General: No focal deficit present.     Mental Status: She is alert.              A&P  1.  Diarrhea, unspecified type Imodium recommended No source of infection noted, no risk or red flags  Mild bleeding does have hemorrhoid  Encouraged to follow up with PCP if imodium does not ease the diarrhea Education on the self limiting nature of diarrhea usually   Reviewed side effects, risks and benefits of medication.  Patient acknowledged agreement and understanding of the plan.    I discussed the assessment and treatment plan with the patient. The patient was provided an opportunity to ask questions and all were answered. The patient agreed with the plan and demonstrated an understanding of the instructions.   The patient was advised to call back or seek an in-person evaluation if the symptoms worsen or if the condition fails to improve as anticipated.   The above assessment and management plan was discussed with the patient. The patient verbalized understanding of and has agreed to the management plan. Patient is aware to call the clinic if symptoms persist or worsen. Patient is aware when to return to the clinic for a follow-up visit. Patient educated on when it is appropriate to go to the emergency department.   Time:   Today, I have spent 15 minutes with the patient with telehealth technology discussing the above problems, reviewing the chart, previous notes, medications and orders.   Medication Changes: No orders of the defined types were placed in this encounter.    Disposition:  Follow up prn Signed, Freddy Finner, NP  02/07/2021 1:56 PM

## 2021-02-07 NOTE — Patient Instructions (Addendum)
Bland Diet A bland diet consists of foods that are often soft and do not have a lot of fat, fiber, or extra seasonings. Foods without fat, fiber, or seasoning are easier for the body to digest. They are also less likely to irritate your mouth, throat, stomach, and other parts of your digestive system. A bland dietis sometimes called a BRAT diet. What is my plan? Your health care provider or food and nutrition specialist (dietitian) may recommend specific changes to your diet to prevent symptoms or to treat your symptoms. These changes may include: Eating small meals often. Cooking food until it is soft enough to chew easily. Chewing your food well. Drinking fluids slowly. Not eating foods that are very spicy, sour, or fatty. Not eating citrus fruits, such as oranges and grapefruit. What do I need to know about this diet? Eat a variety of foods from the bland diet food list. Do not follow a bland diet longer than needed. Ask your health care provider whether you should take vitamins or supplements. What foods can I eat? Grains  Hot cereals, such as cream of wheat. Rice. Bread, crackers, or tortillas madefrom refined white flour. Vegetables Canned or cooked vegetables. Mashed or boiled potatoes. Fruits  Bananas. Applesauce. Other types of cooked or canned fruit with the skin andseeds removed, such as canned peaches or pears. Meats and other proteins  Scrambled eggs. Creamy peanut butter or other nut butters. Lean, well-cookedmeats, such as chicken or fish. Tofu. Soups or broths. Dairy Low-fat dairy products, such as milk, cottage cheese, or yogurt. Beverages  Water. Herbal tea. Apple juice. Fats and oils Mild salad dressings. Canola or olive oil. Sweets and desserts Pudding. Custard. Fruit gelatin. Ice cream. The items listed above may not be a complete list of recommended foods and beverages. Contact a dietitian for more options. What foods are not recommended? Grains Whole grain  breads and cereals. Vegetables Raw vegetables. Fruits Raw fruits, especially citrus, berries, or dried fruits. Dairy Whole fat dairy foods. Beverages Caffeinated drinks. Alcohol. Seasonings and condiments Strongly flavored seasonings or condiments. Hot sauce. Salsa. Other foods Spicy foods. Fried foods. Sour foods, such as pickled or fermented foods. Foodswith high sugar content. Foods high in fiber. The items listed above may not be a complete list of foods and beverages to avoid. Contact a dietitian for more information. Summary A bland diet consists of foods that are often soft and do not have a lot of fat, fiber, or extra seasonings. Foods without fat, fiber, or seasoning are easier for the body to digest. Check with your health care provider to see how long you should follow this diet plan. It is not meant to be followed for long periods. This information is not intended to replace advice given to you by your health care provider. Make sure you discuss any questions you have with your healthcare provider. Document Revised: 07/10/2017 Document Reviewed: 07/10/2017 Elsevier Patient Education  2022 [employer] Inc[employer]  Diarrhea, Adult Diarrhea is frequent loose and watery bowel movements. Diarrhea can make you feel weak and cause you to become dehydrated. Dehydration can make you tiredand thirsty, cause you to have a dry mouth, and decrease how often you urinate. Diarrhea typically lasts 2-3 days. However, it can last longer if it is a sign of something more serious. It is important to treat your diarrhea as told byyour health care provider. Follow these instructions at home: Eating and drinking     Follow these recommendations as told by your health care  provider: Take an oral rehydration solution (ORS). This is an over-the-counter medicine that helps return your body to its normal balance of nutrients and water. It is found at pharmacies and retail stores. Drink plenty of fluids, such  as water, ice chips, diluted fruit juice, and low-calorie sports drinks. You can drink milk also, if desired. Avoid drinking fluids that contain a lot of sugar or caffeine, such as energy drinks, sports drinks, and soda. Eat bland, easy-to-digest foods in small amounts as you are able. These foods include bananas, applesauce, rice, lean meats, toast, and crackers. Avoid alcohol. Avoid spicy or fatty foods.  Medicines Take over-the-counter and prescription medicines only as told by your health care provider. If you were prescribed an antibiotic medicine, take it as told by your health care provider. Do not stop using the antibiotic even if you start to feel better. General instructions  Wash your hands often using soap and water. If soap and water are not available, use a hand sanitizer. Others in the household should wash their hands as well. Hands should be washed: After using the toilet or changing a diaper. Before preparing, cooking, or serving food. While caring for a sick person or while visiting someone in a hospital. Drink enough fluid to keep your urine pale yellow. Rest at home while you recover. Watch your condition for any changes. Take a warm bath to relieve any burning or pain from frequent diarrhea episodes. Keep all follow-up visits as told by your health care provider. This is important.  Contact a health care provider if: You have a fever. Your diarrhea gets worse. You have new symptoms. You cannot keep fluids down. You feel light-headed or dizzy. You have a headache. You have muscle cramps. Get help right away if: You have chest pain. You feel extremely weak or you faint. You have bloody or black stools or stools that look like tar. You have severe pain, cramping, or bloating in your abdomen. You have trouble breathing or you are breathing very quickly. Your heart is beating very quickly. Your skin feels cold and clammy. You feel confused. You have signs of  dehydration, such as: Dark urine, very little urine, or no urine. Cracked lips. Dry mouth. Sunken eyes. Sleepiness. Weakness. Summary Diarrhea is frequent loose and watery bowel movements. Diarrhea can make you feel weak and cause you to become dehydrated. Drink enough fluids to keep your urine pale yellow. Make sure that you wash your hands after using the toilet. If soap and water are not available, use hand sanitizer. Contact a health care provider if your diarrhea gets worse or you have new symptoms. Get help right away if you have signs of dehydration. This information is not intended to replace advice given to you by your health care provider. Make sure you discuss any questions you have with your healthcare provider. Document Revised: 10/28/2018 Document Reviewed: 11/15/2017 Elsevier Patient Education  2022 [employer].

## 2021-02-09 ENCOUNTER — Telehealth: Payer: Self-pay | Admitting: Family Medicine

## 2021-02-09 NOTE — Telephone Encounter (Signed)
Diarrhea ongoing 7 days  Rec bland foods.  Any fever, abd pain, blood in stool?  Ok for in-office appt if no fever or respiratory symptoms.  May place tomorrow at 12:30pm.

## 2021-02-09 NOTE — Telephone Encounter (Addendum)
Spoke with pt asking relaying Dr. Timoteo Expose message.  Pt states she has been eating bland diet and has not had any diarrhea today.  Stool is a bit firmer.  C/o stomach cramps but no blood in stool or respiratory sxs.  Pt agrees to 12:30 OV tomorrow.  Will have pt added to schedule.   Per Dr. Reece Agar, plz add OV tomorrow (8/19) at 12:30 for diarrhea.  Pt is aware of appt.

## 2021-02-09 NOTE — Telephone Encounter (Signed)
Pt was seen on 8/16 Via Video Visit with [facility].  Pt stated that she is still having diarrhea and taking imodium. She wanted to know what else she could try to help and if she could have an in office appt for the diarrhea.

## 2021-02-09 NOTE — Telephone Encounter (Signed)
Pt has been added to schedule ?

## 2021-02-10 ENCOUNTER — Encounter: Payer: Self-pay | Admitting: Family Medicine

## 2021-02-10 ENCOUNTER — Other Ambulatory Visit: Payer: Self-pay

## 2021-02-10 ENCOUNTER — Ambulatory Visit (INDEPENDENT_AMBULATORY_CARE_PROVIDER_SITE_OTHER): Payer: BC Managed Care – PPO | Admitting: Family Medicine

## 2021-02-10 VITALS — BP 112/70 | HR 69 | Temp 98.0°F | Ht 64.0 in | Wt 149.0 lb

## 2021-02-10 DIAGNOSIS — R197 Diarrhea, unspecified: Secondary | ICD-10-CM | POA: Diagnosis not present

## 2021-02-10 DIAGNOSIS — A0472 Enterocolitis due to Clostridium difficile, not specified as recurrent: Secondary | ICD-10-CM | POA: Insufficient documentation

## 2021-02-10 MED ORDER — DICYCLOMINE HCL 10 MG PO CAPS: 10.0000 mg | ORAL_CAPSULE | Freq: Three times a day (TID) | ORAL | 0 refills | Status: DC

## 2021-02-10 NOTE — Assessment & Plan Note (Addendum)
New, ongoing over the past week associated with abdominal discomfort/cramping and nausea. No improvement noted despite bland diet, imodium use, etc. Diarrhea with stool urgency every time she eats or drinks.  Recently completed flagyl 7d course - ?IBS type reaction  after abx. Will send off GI pathogen panel, treat abdominal cramping with bentyl PRN, start probiotic to re-balance gut flora. Update with effect.

## 2021-02-10 NOTE — Progress Notes (Signed)
Patient ID: Sherry Lara, female    DOB: 03/26/1981, 40 y.o.   MRN: [id number]  This visit was conducted in person.  BP 112/70   Pulse 69   Temp 98 F (36.7 C) (Temporal)   Ht $R'5\' 4"'dg$  (1.626 m)   Wt 149 lb (67.6 kg)   LMP 12/27/2020   SpO2 98%   BMI 25.58 kg/m    CC: ongoing diarrhea Subjective:   HPI: Sherry Lara is a 40 y.o. female presenting on 02/10/2021 for Diarrhea (C/o ongoing diarrhea.  Seen on 8/16 for same sxs. )   Ongoing diarrheal illness for the past week. Saw [place] virtual visit - thought viral gastroenteritis recommended supportive measures including imodium. Diarrhea described as loose stools x4-6/day, some watery, associated with mid lower abdominal pain, nausea, stool urgency after any PO intake. No fevers/chills, vomiting. No urinary symptoms. No blood or grease in stool. Hemorrhoid has been flaring up.   She did test negative for COVID.  No inciting foods noted.  No sick contacts at home. Son may be starting to have GI upset.   She did recently complete course of metronidazole for BV.  She's tried bland diet as well as imodium without much benefit.      Relevant past medical, surgical, family and social history reviewed and updated as indicated. Interim medical history since our last visit reviewed. Allergies and medications reviewed and updated. Outpatient Medications Prior to Visit  Medication Sig Dispense Refill   buPROPion (WELLBUTRIN SR) 150 MG 12 hr tablet Take 1 tablet (150 mg total) by mouth daily. 90 tablet 1   LORazepam (ATIVAN) 0.5 MG tablet Take 1 tablet (0.5 mg total) by mouth 2 (two) times daily as needed for anxiety. 30 tablet 0   No facility-administered medications prior to visit.     Per HPI unless specifically indicated in ROS section below Review of Systems  Objective:  BP 112/70   Pulse 69   Temp 98 F (36.7 C) (Temporal)   Ht $R'5\' 4"'hJ$  (1.626 m)   Wt 149 lb (67.6 kg)   LMP 12/27/2020   SpO2 98%    BMI 25.58 kg/m   Wt Readings from Last 3 Encounters:  02/10/21 149 lb (67.6 kg)  01/16/21 151 lb 3.2 oz (68.6 kg)  07/19/20 157 lb (71.2 kg)      Physical Exam Vitals and nursing note reviewed.  Constitutional:      Appearance: Normal appearance. She is not ill-appearing.  Abdominal:     General: Abdomen is flat. Bowel sounds are increased. There is no distension.     Palpations: Abdomen is soft. There is no mass.     Tenderness: There is abdominal tenderness (mild) in the suprapubic area. There is no right CVA tenderness, left CVA tenderness, guarding or rebound.     Hernia: No hernia is present.  Skin:    General: Skin is warm and dry.     Findings: No rash.  Neurological:     Mental Status: She is alert.  Psychiatric:        Mood and Affect: Mood normal.        Behavior: Behavior normal.      Results for orders placed or performed in visit on 01/16/21  Cervicovaginal ancillary only  Result Value Ref Range   Bacterial Vaginitis (gardnerella) Positive (A)    Candida Vaginitis Negative    Candida Glabrata Negative    Comment Normal Reference Range Candida Species - Negative  Comment Normal Reference Range Candida Galbrata - Negative    Comment      Normal Reference Range Bacterial Vaginosis - Negative    Assessment & Plan:  This visit occurred during the SARS-CoV-2 public health emergency.  Safety protocols were in place, including screening questions prior to the visit, additional usage of staff PPE, and extensive cleaning of exam room while observing appropriate contact time as indicated for disinfecting solutions.   Problem List Items Addressed This Visit     Diarrhea - Primary    New, ongoing over the past week associated with abdominal discomfort/cramping and nausea. No improvement noted despite bland diet, imodium use, etc. Diarrhea with stool urgency every time she eats or drinks.  Recently completed flagyl 7d course - ?IBS type reaction  after abx. Will  send off GI pathogen panel, treat abdominal cramping with bentyl PRN, start probiotic to re-balance gut flora. Update with effect.       Relevant Orders   Gastrointestinal Pathogen Panel PCR     Meds ordered this encounter  Medications   dicyclomine (BENTYL) 10 MG capsule    Sig: Take 1 capsule (10 mg total) by mouth 3 (three) times daily before meals.    Dispense:  30 capsule    Refill:  0   Orders Placed This Encounter  Procedures   Gastrointestinal Pathogen Panel PCR    Standing Status:   Future    Standing Expiration Date:   02/10/2022    Patient Instructions  Pass by lab to pick up stool kit for infection. In the meantime, start probiotic like align or phillips colon health probiotic daily.  Continue bland diet, push fluids to ensure staying well hydrated.  May take bentyl (dicyclomine) as needed for abdominal cramping.   Follow up plan: Return if symptoms worsen or fail to improve.  Ria Bush, MD

## 2021-02-10 NOTE — Patient Instructions (Addendum)
Pass by lab to pick up stool kit for infection. In the meantime, start probiotic like align or phillips colon health probiotic daily.  Continue bland diet, push fluids to ensure staying well hydrated.  May take bentyl (dicyclomine) as needed for abdominal cramping.

## 2021-02-13 ENCOUNTER — Other Ambulatory Visit: Payer: BC Managed Care – PPO

## 2021-02-13 DIAGNOSIS — R197 Diarrhea, unspecified: Secondary | ICD-10-CM

## 2021-02-14 ENCOUNTER — Encounter: Payer: Self-pay | Admitting: Family Medicine

## 2021-02-14 DIAGNOSIS — M5442 Lumbago with sciatica, left side: Secondary | ICD-10-CM

## 2021-02-14 DIAGNOSIS — A0472 Enterocolitis due to Clostridium difficile, not specified as recurrent: Secondary | ICD-10-CM

## 2021-02-14 MED ORDER — CIPROFLOXACIN HCL 500 MG PO TABS: 500.0000 mg | ORAL_TABLET | Freq: Two times a day (BID) | ORAL | 0 refills | Status: DC

## 2021-02-14 NOTE — Telephone Encounter (Signed)
Spoke with patient. No recent travel. No blood in stool.

## 2021-02-15 NOTE — Telephone Encounter (Signed)
I spoke with pt and she started with bright red blood on tissue when wiped BM and also blood streaks mixed with stool. Pt said has had diarrhea x 5 this morning. Pt also has external hemorrhoid. Pt has not seen any blood clots. Pt has abd cramping that is more uncomfortable than painful all the way across lower abdomen. No fever.pt just started Cipro last night on 02/14/21. Sending note to Dr Reece Agar and Misty Stanley CMA. Will also send teams note as well.

## 2021-02-15 NOTE — Telephone Encounter (Signed)
Replied via mychart. No change for now. GI pathogen panel pending.

## 2021-02-17 LAB — GASTROINTESTINAL PATHOGEN PANEL PCR
C. difficile Tox A/B, PCR: DETECTED — AB
Campylobacter, PCR: NOT DETECTED
Cryptosporidium, PCR: NOT DETECTED
E coli (ETEC) LT/ST PCR: NOT DETECTED
E coli (STEC) stx1/stx2, PCR: NOT DETECTED
E coli 0157, PCR: NOT DETECTED
Giardia lamblia, PCR: NOT DETECTED
Norovirus, PCR: NOT DETECTED
Rotavirus A, PCR: NOT DETECTED
Salmonella, PCR: NOT DETECTED
Shigella, PCR: NOT DETECTED

## 2021-02-19 ENCOUNTER — Encounter: Payer: Self-pay | Admitting: Family Medicine

## 2021-02-19 MED ORDER — VANCOMYCIN HCL 125 MG PO CAPS: 125.0000 mg | ORAL_CAPSULE | Freq: Four times a day (QID) | ORAL | 0 refills | Status: AC

## 2021-02-19 NOTE — Addendum Note (Signed)
Addended by: Eustaquio Boyden on: 02/19/2021 07:18 PM   Modules accepted: Orders

## 2021-02-19 NOTE — Telephone Encounter (Addendum)
Positive C diff infection - start oral vancomycin 10d course.  Update with effect. Discussed contagiousness of infection while diarrheal illness persists.

## 2021-02-21 NOTE — Addendum Note (Signed)
Addended by: Eustaquio Boyden on: 02/21/2021 04:49 PM   Modules accepted: Orders

## 2021-02-22 ENCOUNTER — Other Ambulatory Visit (INDEPENDENT_AMBULATORY_CARE_PROVIDER_SITE_OTHER): Payer: BC Managed Care – PPO

## 2021-02-22 ENCOUNTER — Other Ambulatory Visit: Payer: Self-pay

## 2021-02-22 DIAGNOSIS — A0472 Enterocolitis due to Clostridium difficile, not specified as recurrent: Secondary | ICD-10-CM

## 2021-02-22 LAB — COMPREHENSIVE METABOLIC PANEL
ALT: 13 U/L (ref 0–35)
AST: 18 U/L (ref 0–37)
Albumin: 4.3 g/dL (ref 3.5–5.2)
Alkaline Phosphatase: 45 U/L (ref 39–117)
BUN: 12 mg/dL (ref 6–23)
CO2: 30 mEq/L (ref 19–32)
Calcium: 9.9 mg/dL (ref 8.4–10.5)
Chloride: 100 mEq/L (ref 96–112)
Creatinine, Ser: 0.81 mg/dL (ref 0.40–1.20)
GFR: 90.08 mL/min (ref >60.00)
Glucose, Bld: 71 mg/dL (ref 70–99)
Potassium: 3.9 mEq/L (ref 3.5–5.1)
Sodium: 137 mEq/L (ref 135–145)
Total Bilirubin: 0.8 mg/dL (ref 0.2–1.2)
Total Protein: 7.3 g/dL (ref 6.0–8.3)

## 2021-02-22 LAB — CBC WITH DIFFERENTIAL/PLATELET
Basophils Absolute: 0 10*3/uL (ref 0.0–0.1)
Basophils Relative: 0.2 % (ref 0.0–3.0)
Eosinophils Absolute: 0 10*3/uL (ref 0.0–0.7)
Eosinophils Relative: 0.1 % (ref 0.0–5.0)
HCT: 40.1 % (ref 36.0–46.0)
Hemoglobin: 13.4 g/dL (ref 12.0–15.0)
Lymphocytes Relative: 19.8 % (ref 12.0–46.0)
Lymphs Abs: 1.4 10*3/uL (ref 0.7–4.0)
MCHC: 33.5 g/dL (ref 30.0–36.0)
MCV: 84.7 fl (ref 78.0–100.0)
Monocytes Absolute: 0.5 10*3/uL (ref 0.1–1.0)
Monocytes Relative: 7.6 % (ref 3.0–12.0)
Neutro Abs: 5.2 10*3/uL (ref 1.4–7.7)
Neutrophils Relative %: 72.3 % (ref 43.0–77.0)
Platelets: 404 10*3/uL — ABNORMAL HIGH (ref 150.0–400.0)
RBC: 4.73 Mil/uL (ref 3.87–5.11)
RDW: 13.5 % (ref 11.5–15.5)
WBC: 7.2 10*3/uL (ref 4.0–10.5)

## 2021-02-22 NOTE — Telephone Encounter (Signed)
Spoke with pt to offer 2:00 today with Dr. Reece Agar.  Pt states she was seen this morning at 8:40.

## 2021-02-23 ENCOUNTER — Ambulatory Visit
Admission: RE | Admit: 2021-02-23 | Discharge: 2021-02-23 | Disposition: A | Discharge status: Home / Self Care | Payer: BC Managed Care – PPO | Source: Ambulatory Visit | Attending: Family Medicine | Admitting: Family Medicine

## 2021-02-23 ENCOUNTER — Ambulatory Visit
Admission: RE | Admit: 2021-02-23 | Discharge: 2021-02-23 | Disposition: A | Discharge status: Home / Self Care | Payer: BC Managed Care – PPO | Attending: Family Medicine | Admitting: Family Medicine

## 2021-02-23 DIAGNOSIS — M5442 Lumbago with sciatica, left side: Secondary | ICD-10-CM | POA: Insufficient documentation

## 2021-02-23 NOTE — Telephone Encounter (Signed)
Pt has been scheduled.  °

## 2021-02-23 NOTE — Telephone Encounter (Signed)
See pt mychart message. I ordered xrays for [facility]. Please schedule OV for 4pm tomorrow.

## 2021-02-23 NOTE — Addendum Note (Signed)
Addended by: Eustaquio Boyden on: 02/23/2021 11:15 AM   Modules accepted: Orders

## 2021-02-23 NOTE — Telephone Encounter (Addendum)
Plz schedule OV tomorrow at 4:00 for low back pain.  Pt is aware.

## 2021-02-24 ENCOUNTER — Telehealth (INDEPENDENT_AMBULATORY_CARE_PROVIDER_SITE_OTHER): Payer: BC Managed Care – PPO | Admitting: Family Medicine

## 2021-02-24 ENCOUNTER — Encounter: Payer: Self-pay | Admitting: Family Medicine

## 2021-02-24 VITALS — BP 110/62 | HR 75 | Temp 98.0°F | Ht 64.0 in | Wt 144.4 lb

## 2021-02-24 DIAGNOSIS — M5442 Lumbago with sciatica, left side: Secondary | ICD-10-CM | POA: Insufficient documentation

## 2021-02-24 DIAGNOSIS — A0472 Enterocolitis due to Clostridium difficile, not specified as recurrent: Secondary | ICD-10-CM | POA: Diagnosis not present

## 2021-02-24 MED ORDER — TRAMADOL HCL 50 MG PO TABS: 25.0000 mg | ORAL_TABLET | Freq: Two times a day (BID) | ORAL | 0 refills | Status: DC | PRN

## 2021-02-24 NOTE — Progress Notes (Signed)
Patient ID: Sherry Lara, female    DOB: 1981/03/06, 40 y.o.   MRN: [id number]  Virtual visit completed through MyChart, a video enabled telemedicine application. Due to national recommendations of social distancing due to COVID-19, a virtual visit is felt to be most appropriate for this patient at this time. Reviewed limitations, risks, security and privacy concerns of performing a virtual visit and the availability of in person appointments. I also reviewed that there may be a patient responsible charge related to this service. The patient agreed to proceed.   Patient location: home Provider location: [facility], office Persons participating in this virtual visit: patient, provider   If any vitals were documented, they were collected by patient at home unless specified below.    BP 110/62   Pulse 75   Temp 98 F (36.7 C)   Ht 5\' 4"  (1.626 m)   Wt 144 lb 6 oz (65.5 kg)   LMP 02/18/2021   BMI 24.78 kg/m    CC: low back pain, C diff infection Subjective:   HPI: Sherry Lara is a 40 y.o. female presenting on 02/24/2021 for Back Pain (C/o low back pain, worse on left side.  Started 02/14/21.  Had x-ray yesterday at [facility]. )   Recent diagnosis of community acquired C diff infection, started treatment with 10d oral vancomycin course 02/19/2021. Diarrhea is improved with vanc. Bowels now regular and back to normal.   L>R lower back pain started 02/14/2021, associated with lower back stiffness, worse with transitions from sitting to standing and twisting motions. Some numbness radiating from lower back down buttock into posterior thigh.  Saw chiropractor yesterday s/p adjustment with some benefit.  No UTI symptoms.  No shooting pain down leg, no numbness/weakness of legs, saddle anesthesia, fever.  Treating with ibuprofen then aleve. Using ice/heating pad.   Husband sick with COVID.  Last week she had sinus pressure but symptoms have largely  resolved. She did home COVID test today - negative.   Was scheduled for OV today however this was converted to virtual visit after husband tested positive for COVID today.       Relevant past medical, surgical, family and social history reviewed and updated as indicated. Interim medical history since our last visit reviewed. Allergies and medications reviewed and updated. Outpatient Medications Prior to Visit  Medication Sig Dispense Refill   buPROPion (WELLBUTRIN SR) 150 MG 12 hr tablet Take 1 tablet (150 mg total) by mouth daily. 90 tablet 1   dicyclomine (BENTYL) 10 MG capsule Take 1 capsule (10 mg total) by mouth 3 (three) times daily before meals. 30 capsule 0   LORazepam (ATIVAN) 0.5 MG tablet Take 1 tablet (0.5 mg total) by mouth 2 (two) times daily as needed for anxiety. 30 tablet 0   vancomycin (VANCOCIN) 125 MG capsule Take 1 capsule (125 mg total) by mouth 4 (four) times daily for 10 days. 40 capsule 0   No facility-administered medications prior to visit.     Per HPI unless specifically indicated in ROS section below Review of Systems Objective:  BP 110/62   Pulse 75   Temp 98 F (36.7 C)   Ht 5\' 4"  (1.626 m)   Wt 144 lb 6 oz (65.5 kg)   LMP 02/18/2021   BMI 24.78 kg/m   Wt Readings from Last 3 Encounters:  02/24/21 144 lb 6 oz (65.5 kg)  02/10/21 149 lb (67.6 kg)  01/16/21 151 lb 3.2 oz (  68.6 kg)       Physical exam: Gen: alert, NAD, not ill appearing Pulm: speaks in complete sentences without increased work of breathing Psych: normal mood, normal thought content      Results for orders placed or performed in visit on 02/22/21  CBC with Differential/Platelet  Result Value Ref Range   WBC 7.2 4.0 - 10.5 K/uL   RBC 4.73 3.87 - 5.11 Mil/uL   Hemoglobin 13.4 12.0 - 15.0 g/dL   HCT 68.0 32.1 - 22.4 %   MCV 84.7 78.0 - 100.0 fl   MCHC 33.5 30.0 - 36.0 g/dL   RDW 82.5 00.3 - 70.4 %   Platelets 404.0 (H) 150.0 - 400.0 K/uL   Neutrophils Relative % 72.3 43.0  - 77.0 %   Lymphocytes Relative 19.8 12.0 - 46.0 %   Monocytes Relative 7.6 3.0 - 12.0 %   Eosinophils Relative 0.1 0.0 - 5.0 %   Basophils Relative 0.2 0.0 - 3.0 %   Neutro Abs 5.2 1.4 - 7.7 K/uL   Lymphs Abs 1.4 0.7 - 4.0 K/uL   Monocytes Absolute 0.5 0.1 - 1.0 K/uL   Eosinophils Absolute 0.0 0.0 - 0.7 K/uL   Basophils Absolute 0.0 0.0 - 0.1 K/uL  Comprehensive metabolic panel  Result Value Ref Range   Sodium 137 135 - 145 mEq/L   Potassium 3.9 3.5 - 5.1 mEq/L   Chloride 100 96 - 112 mEq/L   CO2 30 19 - 32 mEq/L   Glucose, Bld 71 70 - 99 mg/dL   BUN 12 6 - 23 mg/dL   Creatinine, Ser 8.88 0.40 - 1.20 mg/dL   Total Bilirubin 0.8 0.2 - 1.2 mg/dL   Alkaline Phosphatase 45 39 - 117 U/L   AST 18 0 - 37 U/L   ALT 13 0 - 35 U/L   Total Protein 7.3 6.0 - 8.3 g/dL   Albumin 4.3 3.5 - 5.2 g/dL   GFR 91.69 >45.03 mL/min   Calcium 9.9 8.4 - 10.5 mg/dL   Assessment & Plan:   Problem List Items Addressed This Visit     C. difficile colitis    Significant improvement even prior to starting vancomycin, bowels seem back to normal - 1 a day. Discussed finishing full oral vancymycin course. Update [place] if any recurrent symptoms.       Acute low back pain with left-sided sciatica - Primary    Anticipate flare of sciatica in setting of frequent BM use due to recent C diff diarrheal illness.  She recently saw chiropractor with benefit.  NSAIDs not helping.  Avoid prednisone while we're treating C diff bacterial infection.  Intolerance to stronger pain meds.  Will cautiously trial low dose tramadol 1/2-1 tab PRN low back pain, continue gentle stretching, ice/heat, NSAID.  Discussed option of coming in for toradol 30mg  IM if no improvement over the weekend.  Xray read still pending but with preserved intervetebral space on my read - obtained for lower back pain in setting of C diff infection. Recent labs also reassuring.       Relevant Medications   traMADol (ULTRAM) 50 MG tablet     Meds  ordered this encounter  Medications   traMADol (ULTRAM) 50 MG tablet    Sig: Take 0.5-1 tablets (25-50 mg total) by mouth 2 (two) times daily as needed.    Dispense:  15 tablet    Refill:  0   No orders of the defined types were placed in this encounter.  I discussed the assessment and treatment plan with the patient. The patient was provided an opportunity to ask questions and all were answered. The patient agreed with the plan and demonstrated an understanding of the instructions. The patient was advised to call back or seek an in-person evaluation if the symptoms worsen or if the condition fails to improve as anticipated.  Follow up plan: No follow-ups on file.  Ria Bush, MD

## 2021-02-24 NOTE — Assessment & Plan Note (Signed)
Significant improvement even prior to starting vancomycin, bowels seem back to normal - 1 a day. Discussed finishing full oral vancymycin course. Update [place] if any recurrent symptoms.

## 2021-02-24 NOTE — Assessment & Plan Note (Addendum)
Anticipate flare of sciatica in setting of frequent BM use due to recent C diff diarrheal illness.  She recently saw chiropractor with benefit.  NSAIDs not helping.  Avoid prednisone while we're treating C diff bacterial infection.  Intolerance to stronger pain meds.  Will cautiously trial low dose tramadol 1/2-1 tab PRN low back pain, continue gentle stretching, ice/heat, NSAID.  Discussed option of coming in for toradol 30mg  IM if no improvement over the weekend.  Xray read still pending but with preserved intervetebral space on my read - obtained for lower back pain in setting of C diff infection. Recent labs also reassuring.

## 2021-02-27 ENCOUNTER — Encounter: Payer: Self-pay | Admitting: Family Medicine

## 2021-03-02 ENCOUNTER — Other Ambulatory Visit: Payer: Self-pay

## 2021-03-02 ENCOUNTER — Encounter: Payer: Self-pay | Admitting: Family Medicine

## 2021-03-02 ENCOUNTER — Ambulatory Visit (INDEPENDENT_AMBULATORY_CARE_PROVIDER_SITE_OTHER): Payer: BC Managed Care – PPO | Admitting: Family Medicine

## 2021-03-02 VITALS — BP 122/80 | HR 78 | Temp 98.8°F | Ht 64.0 in | Wt 152.8 lb

## 2021-03-02 DIAGNOSIS — M5416 Radiculopathy, lumbar region: Secondary | ICD-10-CM | POA: Diagnosis not present

## 2021-03-02 DIAGNOSIS — Z8619 Personal history of other infectious and parasitic diseases: Secondary | ICD-10-CM

## 2021-03-02 MED ORDER — DEXAMETHASONE SODIUM PHOSPHATE 100 MG/10ML IJ SOLN
10.0000 mg | Freq: Once | INTRAMUSCULAR | Status: AC
  Administered 2021-03-02: 10 mg via INTRAMUSCULAR

## 2021-03-02 MED ORDER — CYCLOBENZAPRINE HCL 10 MG PO TABS: 5.0000 mg | ORAL_TABLET | Freq: Every evening | ORAL | 0 refills | Status: DC | PRN

## 2021-03-02 MED ORDER — KETOROLAC TROMETHAMINE 60 MG/2ML IM SOLN
60.0000 mg | Freq: Once | INTRAMUSCULAR | Status: AC
  Administered 2021-03-02: 60 mg via INTRAMUSCULAR

## 2021-03-02 NOTE — Progress Notes (Signed)
[name], MD, CAQ Sports Medicine [facility] [address] [place], [address]  Phone: [phone]  FAX: [phone]  [name] - 40 y.o. female  MRN [id number]  Date of Birth: 1981-02-29  Date: 03/02/2021  PCP: Eustaquio Boyden, MD  Referral: Eustaquio Boyden, MD  Chief Complaint  Patient presents with   Back Pain    Ongoing    This visit occurred during the SARS-CoV-2 public health emergency.  Safety protocols were in place, including screening questions prior to the visit, additional usage of staff PPE, and extensive cleaning of exam room while observing appropriate contact time as indicated for disinfecting solutions.   Subjective:   Sherry Lara is a 40 y.o. very pleasant female patient with Body mass index is 26.22 kg/m. who presents with the following:  Is a very pleasant young lady, and she is globally very healthy, and unfortunately within the last months she did develop some C. difficile colitis.  At this point after starting her oral vancomycin she is doing much better, and she has not had any dramatic diarrhea in the last week.  3 weeks ago started to get it and down her left pain.  Also along her spine. Did have some radiculopathy.  She denies any numbness or weakness.  F did give her some tramadol. Not the midback. Chiro did help a little bit.  She has been taking some ibuprofen, and this is helped somewhat.  No weakness No numbness  No history of significant back pain Normally does yoga and very active.   Rotational movement is poor -She worries that this may impact her driving.  We reviewed her plain films together in the office.  And on my interpretation the patient has very minimal degenerative disc disease globally.  She has fairly minimal facet arthropathy.  Overall, this is a very reassuring lumbar spine film.  This is on my independent review. Electronically Signed   By: Hannah Beat, MD On: 03/02/2021 11:00 AM EDT   Review of Systems is noted in the HPI, as appropriate   Objective:   BP 122/80   Pulse 78   Temp 98.8 F (37.1 C) (Temporal)   Ht 5\' 4"  (1.626 m)   Wt 152 lb 12 oz (69.3 kg)   LMP 02/18/2021   SpO2 99%   BMI 26.22 kg/m    Range of motion at  the waist: Flexion, extension, lateral bending and rotation: She is able to forward flex with hands beyond the knees.  Extension does cause some pain.  Lateral bending is notably restricted and causes some pain, to a significantly lesser extent rotational movements.  No echymosis or edema Rises to examination table with mild difficulty Gait: minimally antalgic  Inspection/Deformity: N Paraspinus Tenderness: L3-S1 bilaterally  B Ankle Dorsiflexion (L5,4): 5/5 B Great Toe Dorsiflexion (L5,4): 5/5 Heel Walk (L5): WNL Toe Walk (S1): WNL Rise/Squat (L4): WNL, mild pain  SENSORY B Medial Foot (L4): WNL B Dorsum (L5): WNL B Lateral (S1): WNL Light Touch: WNL Pinprick: WNL  REFLEXES Knee (L4): 2+ Ankle (S1): 2+  B SLR, seated: neg B SLR, supine: neg B FABER: neg B Reverse FABER: neg B Greater Troch: NT B Log Roll: neg B Sciatic Notch: NT   Radiology: DG Lumbar Spine Complete  Result Date: 02/27/2021 CLINICAL DATA:  Lower back pain with radiculopathy EXAM: LUMBAR SPINE - COMPLETE 4+ VIEW COMPARISON:  None. FINDINGS: There are 5 non-rib-bearing lumbar vertebrae. There  is no evidence of lumbar spine fracture. There is mild disc height loss at L1-L2 and L2-L3. There is mild lower lumbar predominant facet arthropathy. Normal lumbar spine alignment. IMPRESSION: Mild disc height loss at L1-L2 and L2-L3. Mild lower lumbar predominant facet arthropathy. Electronically Signed   By: Caprice Renshaw M.D.   On: 02/27/2021 10:11    Assessment and Plan:     ICD-10-CM   1. Lumbar radiculopathy, acute  M54.16 ketorolac (TORADOL) injection 60 mg    dexamethasone (DECADRON) injection 10 mg    2.  History of Clostridioides difficile colitis  Z86.19      Lumbar radiculopathy isolated to the left side.  With the basically normal lumbar spine film, this would be more likely discogenic.  I did give her some basic McKenzie style protocol range of motion and spine work for an acute injury.  I am going to try to bypass her gut today in the office but at the same time give her some high-dose Toradol and give her a Decadron injection which I hope will give her stool more immediate and more long-term relief.  Social she is normally very active, and right now her back pain is limiting her to a big degree.  At baseline she is very fit and does yoga routinely.  If her symptoms do persist then I think formal physical therapy would be a great idea.  Meds ordered this encounter  Medications   cyclobenzaprine (FLEXERIL) 10 MG tablet    Sig: Take 0.5-1 tablets (5-10 mg total) by mouth at bedtime as needed for muscle spasms.    Dispense:  30 tablet    Refill:  0   ketorolac (TORADOL) injection 60 mg   dexamethasone (DECADRON) injection 10 mg   Medications Discontinued During This Encounter  Medication Reason   dicyclomine (BENTYL) 10 MG capsule Completed Course   No orders of the defined types were placed in this encounter.   Follow-up: No follow-ups on file.  Dragon [employer] One speech-to-text software was used for transcription in this dictation.  Possible transcriptional errors can occur using Animal nutritionist.   Signed,  Elpidio Galea. Menucha Dicesare, MD   Outpatient Encounter Medications as of 03/02/2021  Medication Sig   cyclobenzaprine (FLEXERIL) 10 MG tablet Take 0.5-1 tablets (5-10 mg total) by mouth at bedtime as needed for muscle spasms.   buPROPion (WELLBUTRIN SR) 150 MG 12 hr tablet Take 1 tablet (150 mg total) by mouth daily.   LORazepam (ATIVAN) 0.5 MG tablet Take 1 tablet (0.5 mg total) by mouth 2 (two) times daily as needed for anxiety.   traMADol (ULTRAM) 50 MG tablet Take 0.5-1  tablets (25-50 mg total) by mouth 2 (two) times daily as needed.   [DISCONTINUED] dicyclomine (BENTYL) 10 MG capsule Take 1 capsule (10 mg total) by mouth 3 (three) times daily before meals.   [EXPIRED] dexamethasone (DECADRON) injection 10 mg    [EXPIRED] ketorolac (TORADOL) injection 60 mg    No facility-administered encounter medications on file as of 03/02/2021.

## 2021-04-06 ENCOUNTER — Encounter: Payer: BLUE CROSS/BLUE SHIELD | Admitting: Obstetrics and Gynecology

## 2021-04-06 ENCOUNTER — Other Ambulatory Visit: Payer: Self-pay | Admitting: Family Medicine

## 2021-06-10 ENCOUNTER — Encounter: Payer: Self-pay | Admitting: Nurse Practitioner

## 2021-06-10 ENCOUNTER — Telehealth: Payer: BC Managed Care – PPO | Admitting: Nurse Practitioner

## 2021-06-10 DIAGNOSIS — J111 Influenza due to unidentified influenza virus with other respiratory manifestations: Secondary | ICD-10-CM

## 2021-06-10 MED ORDER — OSELTAMIVIR PHOSPHATE 75 MG PO CAPS: 75.0000 mg | ORAL_CAPSULE | Freq: Two times a day (BID) | ORAL | 0 refills | Status: AC

## 2021-06-10 NOTE — Progress Notes (Signed)
Virtual Visit Consent   ANY MCNEICE Sherry Lara, you are scheduled for a virtual visit with a [facility] provider today.     Just as with appointments in the office, your consent must be obtained to participate.  Your consent will be active for this visit and any virtual visit you may have with one of our providers in the next 365 days.     If you have a MyChart account, a copy of this consent can be sent to you electronically.  All virtual visits are billed to your insurance company just like a traditional visit in the office.    As this is a virtual visit, video technology does not allow for your provider to perform a traditional examination.  This may limit your provider's ability to fully assess your condition.  If your provider identifies any concerns that need to be evaluated in person or the need to arrange testing (such as labs, EKG, etc.), we will make arrangements to do so.     Although advances in technology are sophisticated, we cannot ensure that it will always work on either your end or our end.  If the connection with a video visit is poor, the visit may have to be switched to a telephone visit.  With either a video or telephone visit, we are not always able to ensure that we have a secure connection.     I need to obtain your verbal consent now.   Are you willing to proceed with your visit today?    Sherry Sherry Lara Sherry Lara has provided verbal consent on 06/10/2021 for a virtual visit (video or telephone).   Sherry Simas, FNP   Date: 06/10/2021 7:57 AM   Virtual Visit via Video Note   I, Sherry Sherry Lara, connected with  Sherry Sherry Lara  ([id number], [birth year]) on 06/10/21 at  8:00 AM EST by a video-enabled telemedicine application and verified that I am speaking with the correct person using two identifiers.  Location: Patient: Virtual Visit Location Patient: Home Provider: Virtual Visit Location Provider: Home Office   I discussed the limitations of  evaluation and management by telemedicine and the availability of in person appointments. The patient expressed understanding and agreed to proceed.    History of Present Illness: Sherry Sherry Lara is a 40 y.o. who identifies as a female who was assigned female at birth, and is being seen today with flu like symptoms.  Patient woke up last night with chills, sore throat, body aches, congestion.   She did have asthma as a child, most recently used an inhaler about 10 years ago.   Daughter was recently diagnosed with flu 2-3 days ago.   She has had her annual flu vaccine this year.   Has not taken a home COVID test  Problems:  Patient Active Problem List   Diagnosis Date Noted   Acute low back pain with left-sided sciatica 02/24/2021   C. difficile colitis 02/10/2021   GAD (generalized anxiety disorder) 07/19/2020   Moderate episode of recurrent major depressive disorder (HCC) 03/29/2017   Panic attacks 03/29/2017   History of C-section 01/02/2017   TMJ dysfunction 03/27/2016   Health maintenance examination 03/25/2015    Allergies:  Allergies  Allergen Reactions   Vicodin [Hydrocodone-Acetaminophen] Nausea And Vomiting   Codeine Rash   Penicillins Rash   Medications:  Current Outpatient Medications:    buPROPion (WELLBUTRIN SR) 150 MG 12 hr tablet, TAKE 1 TABLET BY MOUTH EVERY DAY, Disp: 90 tablet, Rfl:  1   cyclobenzaprine (FLEXERIL) 10 MG tablet, Take 0.5-1 tablets (5-10 mg total) by mouth at bedtime as needed for muscle spasms., Disp: 30 tablet, Rfl: 0   LORazepam (ATIVAN) 0.5 MG tablet, Take 1 tablet (0.5 mg total) by mouth 2 (two) times daily as needed for anxiety., Disp: 30 tablet, Rfl: 0   traMADol (ULTRAM) 50 MG tablet, Take 0.5-1 tablets (25-50 mg total) by mouth 2 (two) times daily as needed., Disp: 15 tablet, Rfl: 0  Observations/Objective: Patient is well-developed, well-nourished in no acute distress.  Resting comfortably at home.  Head is normocephalic,  atraumatic.  No labored breathing.  Speech is clear and coherent with logical content.  Patient is alert and oriented at baseline.    Assessment and Plan: 1. Flu Take anti-viral with food.  - oseltamivir (TAMIFLU) 75 MG capsule; Take 1 capsule (75 mg total) by mouth 2 (two) times daily for 5 days.  Dispense: 10 capsule; Refill: 0    Continue to manage symptoms with over the counter medications as discussed   Follow Up Instructions: I discussed the assessment and treatment plan with the patient. The patient was provided an opportunity to ask questions and all were answered. The patient agreed with the plan and demonstrated an understanding of the instructions.  A copy of instructions were sent to the patient via MyChart unless otherwise noted below.     The patient was advised to call back or seek an in-person evaluation if the symptoms worsen or if the condition fails to improve as anticipated.  Time:  I spent 10 minutes with the patient via telehealth technology discussing the above problems/concerns.    Sherry Schneiders, FNP

## 2021-08-01 ENCOUNTER — Other Ambulatory Visit: Payer: Self-pay | Admitting: Family Medicine

## 2021-08-01 DIAGNOSIS — Z1231 Encounter for screening mammogram for malignant neoplasm of breast: Secondary | ICD-10-CM

## 2021-09-05 ENCOUNTER — Other Ambulatory Visit: Payer: Self-pay

## 2021-09-05 ENCOUNTER — Ambulatory Visit
Admission: RE | Admit: 2021-09-05 | Discharge: 2021-09-05 | Disposition: A | Discharge status: Home / Self Care | Payer: BC Managed Care – PPO | Source: Ambulatory Visit | Attending: Family Medicine | Admitting: Family Medicine

## 2021-09-05 DIAGNOSIS — Z1231 Encounter for screening mammogram for malignant neoplasm of breast: Secondary | ICD-10-CM | POA: Insufficient documentation

## 2021-10-08 ENCOUNTER — Other Ambulatory Visit: Payer: Self-pay | Admitting: Family Medicine

## 2021-10-09 NOTE — Telephone Encounter (Signed)
Refill request Bupropion ?Last refill 04/06/21 #90/1 ?Last office visit 03/02/21 acute Dr. Patsy Lager ?

## 2021-10-10 NOTE — Telephone Encounter (Signed)
ERx 

## 2022-01-17 ENCOUNTER — Encounter: Payer: BC Managed Care – PPO | Admitting: Certified Nurse Midwife

## 2022-03-01 ENCOUNTER — Encounter: Payer: Self-pay | Admitting: Certified Nurse Midwife

## 2022-03-01 ENCOUNTER — Ambulatory Visit (INDEPENDENT_AMBULATORY_CARE_PROVIDER_SITE_OTHER): Payer: BC Managed Care – PPO | Admitting: Certified Nurse Midwife

## 2022-03-01 ENCOUNTER — Other Ambulatory Visit (HOSPITAL_COMMUNITY)
Admission: RE | Admit: 2022-03-01 | Discharge: 2022-03-01 | Disposition: A | Discharge status: Home / Self Care | Payer: BC Managed Care – PPO | Source: Ambulatory Visit | Attending: Certified Nurse Midwife | Admitting: Certified Nurse Midwife

## 2022-03-01 VITALS — BP 149/77 | HR 62 | Ht 65.0 in | Wt 158.7 lb

## 2022-03-01 DIAGNOSIS — Z1231 Encounter for screening mammogram for malignant neoplasm of breast: Secondary | ICD-10-CM | POA: Diagnosis not present

## 2022-03-01 DIAGNOSIS — Z01419 Encounter for gynecological examination (general) (routine) without abnormal findings: Secondary | ICD-10-CM

## 2022-03-01 DIAGNOSIS — Z124 Encounter for screening for malignant neoplasm of cervix: Secondary | ICD-10-CM | POA: Insufficient documentation

## 2022-03-01 NOTE — Progress Notes (Signed)
GYNECOLOGY ANNUAL PREVENTATIVE CARE ENCOUNTER NOTE  History:     Sherry Lara is a 41 y.o. [phone] female here for a routine annual gynecologic exam.  Current complaints: hot flashes.   Denies abnormal vaginal bleeding, discharge, pelvic pain, problems with intercourse or other gynecologic concerns.     Social Relationship: married  Living: spouses and children (3) Work: Public house manager Exercise: run/yoga 4x wk Smoke/Alcohol/drug use: alcohol -occasionally  Gynecologic History No LMP recorded. Contraception: vasectomy Last Pap: 01/02/2017. Results were: normal with negative HPV Last mammogram: 09/05/2021. Results were: normal  Obstetric History OB History  Gravida Para Term Preterm AB Living  4 2 2   1 3   SAB IAB Ectopic Multiple Live Births  1       3    # Outcome Date GA Lbr Len/2nd Weight Sex Delivery Anes PTL Lv  4 SAB           3 Term      CS-LTranv   LIV  2 Term      CS-LTranv   LIV  1 Gravida      CS-LTranv   LIV    Past Medical History:  Diagnosis Date   Acne    Anxiety    Asthma    Depression    GDM (gestational diabetes mellitus)    History of asthma    childhood   History of chicken pox    History of depression    and anxiety; prior on wellbutrin/celexa   Hx of migraines    improved after pregnancies   Migraines    Vaginal irritation     Past Surgical History:  Procedure Laterality Date   ADENOIDECTOMY     CESAREAN SECTION  2008,2009,2013   CHOLECYSTECTOMY  2009   TONSILLECTOMY  ~1992   WISDOM TOOTH EXTRACTION      Current Outpatient Medications on File Prior to Visit  Medication Sig Dispense Refill   buPROPion (WELLBUTRIN SR) 150 MG 12 hr tablet TAKE 1 TABLET BY MOUTH EVERY DAY 90 tablet 1   cyclobenzaprine (FLEXERIL) 10 MG tablet Take 0.5-1 tablets (5-10 mg total) by mouth at bedtime as needed for muscle spasms. 30 tablet 0   LORazepam (ATIVAN) 0.5 MG tablet Take 1 tablet (0.5 mg total) by mouth 2 (two) times daily as  needed for anxiety. 30 tablet 0   traMADol (ULTRAM) 50 MG tablet Take 0.5-1 tablets (25-50 mg total) by mouth 2 (two) times daily as needed. 15 tablet 0   No current facility-administered medications on file prior to visit.    Allergies  Allergen Reactions   Vicodin [Hydrocodone-Acetaminophen] Nausea And Vomiting   Codeine Rash   Penicillins Rash    Social History:  reports that she has never smoked. She has never used smokeless tobacco. She reports current alcohol use. She reports that she does not use drugs.  Family History  Problem Relation Age of Onset   Hypertension Father    Hyperlipidemia Father        possibly   CAD Neg Hx    Stroke Neg Hx    Diabetes Neg Hx    Cancer Neg Hx     The following portions of the patient's history were reviewed and updated as appropriate: allergies, current medications, past family history, past medical history, past social history, past surgical history and problem list.  Review of Systems Pertinent items noted in HPI and remainder of comprehensive ROS otherwise negative.  Physical Exam:  There were no  vitals taken for this visit. CONSTITUTIONAL: Well-developed, well-nourished female in no acute distress.  HENT:  Normocephalic, atraumatic, External right and left ear normal. Oropharynx is clear and moist EYES: Conjunctivae and EOM are normal. Pupils are equal, round, and reactive to light. No scleral icterus.  NECK: Normal range of motion, supple, no masses.  Normal thyroid.  SKIN: Skin is warm and dry. No rash noted. Not diaphoretic. No erythema. No pallor. MUSCULOSKELETAL: Normal range of motion. No tenderness.  No cyanosis, clubbing, or edema.  2+ distal pulses. NEUROLOGIC: Alert and oriented to person, place, and time. Normal reflexes, muscle tone coordination.  PSYCHIATRIC: Normal mood and affect. Normal behavior. Normal judgment and thought content. CARDIOVASCULAR: Normal heart rate noted, regular rhythm RESPIRATORY: Clear to  auscultation bilaterally. Effort and breath sounds normal, no problems with respiration noted. BREASTS: Symmetric in size. No masses, tenderness, skin changes, nipple drainage, or lymphadenopathy bilaterally.  ABDOMEN: Soft, no distention noted.  No tenderness, rebound or guarding.  PELVIC: Normal appearing external genitalia and urethral meatus; normal appearing vaginal mucosa and cervix.  No abnormal discharge noted. Old blood, pt finishing her cycle. Pap smear obtained.  Normal uterine size, no other palpable masses, no uterine or adnexal tenderness.  .   Assessment and Plan:    1. Encounter for annual routine gynecological examination    Pap: Will follow up results of pap smear and manage accordingly. Mammogram : ordered Labs: declines Refills: none Referral: none  Routine preventative health maintenance measures emphasized. Please refer to After Visit Summary for other counseling recommendations.     Discussed natural options for treating hot flashes, discussed self help options, hormonal and non hormonal medication options. Information sheets given.   Doreene Burke, CNM [facility],  [facility]

## 2022-03-01 NOTE — Patient Instructions (Signed)
Preventive Care 40-42 Years Old, Female Preventive care refers to lifestyle choices and visits with your health care provider that can promote health and wellness. Preventive care visits are also called wellness exams. What can I expect for my preventive care visit? Counseling Your health care provider may ask you questions about your: Medical history, including: Past medical problems. Family medical history. Pregnancy history. Current health, including: Menstrual cycle. Method of birth control. Emotional well-being. Home life and relationship well-being. Sexual activity and sexual health. Lifestyle, including: Alcohol, nicotine or tobacco, and drug use. Access to firearms. Diet, exercise, and sleep habits. Work and work environment. Sunscreen use. Safety issues such as seatbelt and bike helmet use. Physical exam Your health care provider will check your: Height and weight. These may be used to calculate your BMI (body mass index). BMI is a measurement that tells if you are at a healthy weight. Waist circumference. This measures the distance around your waistline. This measurement also tells if you are at a healthy weight and may help predict your risk of certain diseases, such as type 2 diabetes and high blood pressure. Heart rate and blood pressure. Body temperature. Skin for abnormal spots. What immunizations do I need?  Vaccines are usually given at various ages, according to a schedule. Your health care provider will recommend vaccines for you based on your age, medical history, and lifestyle or other factors, such as travel or where you work. What tests do I need? Screening Your health care provider may recommend screening tests for certain conditions. This may include: Lipid and cholesterol levels. Diabetes screening. This is done by checking your blood sugar (glucose) after you have not eaten for a while (fasting). Pelvic exam and Pap test. Hepatitis B test. Hepatitis C  test. HIV (human immunodeficiency virus) test. STI (sexually transmitted infection) testing, if you are at risk. Lung cancer screening. Colorectal cancer screening. Mammogram. Talk with your health care provider about when you should start having regular mammograms. This may depend on whether you have a family history of breast cancer. BRCA-related cancer screening. This may be done if you have a family history of breast, ovarian, tubal, or peritoneal cancers. Bone density scan. This is done to screen for osteoporosis. Talk with your health care provider about your test results, treatment options, and if necessary, the need for more tests. Follow these instructions at home: Eating and drinking  Eat a diet that includes fresh fruits and vegetables, whole grains, lean protein, and low-fat dairy products. Take vitamin and mineral supplements as recommended by your health care provider. Do not drink alcohol if: Your health care provider tells you not to drink. You are pregnant, may be pregnant, or are planning to become pregnant. If you drink alcohol: Limit how much you have to 0-1 drink a day. Know how much alcohol is in your drink. In the [place] one drink equals one 12 oz bottle of beer (355 mL), one 5 oz glass of wine (148 mL), or one 1 oz glass of hard liquor (44 mL). Lifestyle Brush your teeth every morning and night with fluoride toothpaste. Floss one time each day. Exercise for at least 30 minutes 5 or more days each week. Do not use any products that contain nicotine or tobacco. These products include cigarettes, chewing tobacco, and vaping devices, such as e-cigarettes. If you need help quitting, ask your health care provider. Do not use drugs. If you are sexually active, practice safe sex. Use a condom or other form of protection to   prevent STIs. If you do not wish to become pregnant, use a form of birth control. If you plan to become pregnant, see your health care provider for a  prepregnancy visit. Take aspirin only as told by your health care provider. Make sure that you understand how much to take and what form to take. Work with your health care provider to find out whether it is safe and beneficial for you to take aspirin daily. Find healthy ways to manage stress, such as: Meditation, yoga, or listening to music. Journaling. Talking to a trusted person. Spending time with friends and family. Minimize exposure to UV radiation to reduce your risk of skin cancer. Safety Always wear your seat belt while driving or riding in a vehicle. Do not drive: If you have been drinking alcohol. Do not ride with someone who has been drinking. When you are tired or distracted. While texting. If you have been using any mind-altering substances or drugs. Wear a helmet and other protective equipment during sports activities. If you have firearms in your house, make sure you follow all gun safety procedures. Seek help if you have been physically or sexually abused. What's next? Visit your health care provider once a year for an annual wellness visit. Ask your health care provider how often you should have your eyes and teeth checked. Stay up to date on all vaccines. This information is not intended to replace advice given to you by your health care provider. Make sure you discuss any questions you have with your health care provider. Document Revised: 12/07/2020 Document Reviewed: 12/07/2020 Elsevier Patient Education  [place].

## 2022-03-06 LAB — CYTOLOGY - PAP
Comment: NEGATIVE
Diagnosis: UNDETERMINED — AB
High risk HPV: NEGATIVE

## 2022-03-07 ENCOUNTER — Encounter: Payer: Self-pay | Admitting: Certified Nurse Midwife

## 2022-04-05 ENCOUNTER — Other Ambulatory Visit: Payer: Self-pay | Admitting: Family Medicine

## 2022-04-05 DIAGNOSIS — F411 Generalized anxiety disorder: Secondary | ICD-10-CM

## 2022-04-05 DIAGNOSIS — F410 Panic disorder [episodic paroxysmal anxiety] without agoraphobia: Secondary | ICD-10-CM

## 2022-04-05 NOTE — Telephone Encounter (Signed)
Noted  

## 2022-04-05 NOTE — Telephone Encounter (Signed)
E-scribed refill.    Plz schedule physical and lab visits to prevent delays in future refills.

## 2022-04-05 NOTE — Telephone Encounter (Signed)
Patient scheduled.

## 2022-06-03 ENCOUNTER — Other Ambulatory Visit: Payer: Self-pay | Admitting: Family Medicine

## 2022-06-03 DIAGNOSIS — Z13220 Encounter for screening for lipoid disorders: Secondary | ICD-10-CM

## 2022-06-03 DIAGNOSIS — Z131 Encounter for screening for diabetes mellitus: Secondary | ICD-10-CM

## 2022-06-04 ENCOUNTER — Other Ambulatory Visit (INDEPENDENT_AMBULATORY_CARE_PROVIDER_SITE_OTHER): Payer: BC Managed Care – PPO

## 2022-06-04 DIAGNOSIS — Z131 Encounter for screening for diabetes mellitus: Secondary | ICD-10-CM | POA: Diagnosis not present

## 2022-06-04 DIAGNOSIS — Z13220 Encounter for screening for lipoid disorders: Secondary | ICD-10-CM

## 2022-06-04 LAB — BASIC METABOLIC PANEL
BUN: 14 mg/dL (ref 6–23)
CO2: 29 mEq/L (ref 19–32)
Calcium: 9.9 mg/dL (ref 8.4–10.5)
Chloride: 101 mEq/L (ref 96–112)
Creatinine, Ser: 0.84 mg/dL (ref 0.40–1.20)
GFR: 85.46 mL/min (ref >60.00)
Glucose, Bld: 97 mg/dL (ref 70–99)
Potassium: 3.9 mEq/L (ref 3.5–5.1)
Sodium: 138 mEq/L (ref 135–145)

## 2022-06-04 LAB — LIPID PANEL
Cholesterol: 207 mg/dL — ABNORMAL HIGH (ref 0–200)
HDL: 78.8 mg/dL (ref >39.00)
LDL Cholesterol: 112 mg/dL — ABNORMAL HIGH (ref 0–99)
NonHDL: 128.3
Total CHOL/HDL Ratio: 3
Triglycerides: 80 mg/dL (ref 0.0–149.0)
VLDL: 16 mg/dL (ref 0.0–40.0)

## 2022-06-11 ENCOUNTER — Encounter: Payer: Self-pay | Admitting: Family Medicine

## 2022-06-11 ENCOUNTER — Ambulatory Visit (INDEPENDENT_AMBULATORY_CARE_PROVIDER_SITE_OTHER): Payer: BC Managed Care – PPO | Admitting: Family Medicine

## 2022-06-11 VITALS — BP 134/82 | HR 86 | Temp 97.9°F | Ht 64.5 in | Wt 161.0 lb

## 2022-06-11 DIAGNOSIS — F411 Generalized anxiety disorder: Secondary | ICD-10-CM

## 2022-06-11 DIAGNOSIS — Z0000 Encounter for general adult medical examination without abnormal findings: Secondary | ICD-10-CM

## 2022-06-11 DIAGNOSIS — R294 Clicking hip: Secondary | ICD-10-CM | POA: Diagnosis not present

## 2022-06-11 DIAGNOSIS — F331 Major depressive disorder, recurrent, moderate: Secondary | ICD-10-CM

## 2022-06-11 DIAGNOSIS — F410 Panic disorder [episodic paroxysmal anxiety] without agoraphobia: Secondary | ICD-10-CM | POA: Diagnosis not present

## 2022-06-11 DIAGNOSIS — E785 Hyperlipidemia, unspecified: Secondary | ICD-10-CM | POA: Insufficient documentation

## 2022-06-11 DIAGNOSIS — E7800 Pure hypercholesterolemia, unspecified: Secondary | ICD-10-CM

## 2022-06-11 MED ORDER — BUPROPION HCL ER (SR) 150 MG PO TB12: 150.0000 mg | ORAL_TABLET | Freq: Every day | ORAL | 4 refills | Status: DC

## 2022-06-11 MED ORDER — LORAZEPAM 0.5 MG PO TABS: 0.5000 mg | ORAL_TABLET | Freq: Two times a day (BID) | ORAL | 1 refills | Status: DC | PRN

## 2022-06-11 NOTE — Assessment & Plan Note (Signed)
Describes soreness of left hip located at groin associated with clicking sensation. Reassuring exam today. ?clicking hip syndrome - provided with exercises from [facility] pt advisor. Advised let us know if ongoing for xrays and consideration for sports med evaluation vs PT course.

## 2022-06-11 NOTE — Assessment & Plan Note (Signed)
Stable period on low dose wellbutrin - continue.

## 2022-06-11 NOTE — Assessment & Plan Note (Signed)
Mild, reviewed diet choices to improve cholesterol levels. She does note fmhx HLD. The 10-year ASCVD risk score (Arnett DK, et al., 2019) is: 0.4%   Values used to calculate the score:     Age: 41 years     Sex: Female     Is Non-Hispanic African American: No     Diabetic: No     Tobacco smoker: No     Systolic Blood Pressure: 134 mmHg     Is BP treated: No     HDL Cholesterol: 78.8 mg/dL     Total Cholesterol: 207 mg/dL

## 2022-06-11 NOTE — Patient Instructions (Signed)
Good to see you today. Work on fiber and legumes in the diet to keep LDL cholesterol under control. Return as needed or in 1 year for next physical.  Health Maintenance, Female Adopting a healthy lifestyle and getting preventive care are important in promoting health and wellness. Ask your health care provider about: The right schedule for you to have regular tests and exams. Things you can do on your own to prevent diseases and keep yourself healthy. What should I know about diet, weight, and exercise? Eat a healthy diet  Eat a diet that includes plenty of vegetables, fruits, low-fat dairy products, and lean protein. Do not eat a lot of foods that are high in solid fats, added sugars, or sodium. Maintain a healthy weight Body mass index (BMI) is used to identify weight problems. It estimates body fat based on height and weight. Your health care provider can help determine your BMI and help you achieve or maintain a healthy weight. Get regular exercise Get regular exercise. This is one of the most important things you can do for your health. Most adults should: Exercise for at least 150 minutes each week. The exercise should increase your heart rate and make you sweat (moderate-intensity exercise). Do strengthening exercises at least twice a week. This is in addition to the moderate-intensity exercise. Spend less time sitting. Even light physical activity can be beneficial. Watch cholesterol and blood lipids Have your blood tested for lipids and cholesterol at 42 years of age, then have this test every 5 years. Have your cholesterol levels checked more often if: Your lipid or cholesterol levels are high. You are older than 42 years of age. You are at high risk for heart disease. What should I know about cancer screening? Depending on your health history and family history, you may need to have cancer screening at various ages. This may include screening for: Breast cancer. Cervical  cancer. Colorectal cancer. Skin cancer. Lung cancer. What should I know about heart disease, diabetes, and high blood pressure? Blood pressure and heart disease High blood pressure causes heart disease and increases the risk of stroke. This is more likely to develop in people who have high blood pressure readings or are overweight. Have your blood pressure checked: Every 3-5 years if you are 56-20 years of age. Every year if you are 46 years old or older. Diabetes Have regular diabetes screenings. This checks your fasting blood sugar level. Have the screening done: Once every three years after age 63 if you are at a normal weight and have a low risk for diabetes. More often and at a younger age if you are overweight or have a high risk for diabetes. What should I know about preventing infection? Hepatitis B If you have a higher risk for hepatitis B, you should be screened for this virus. Talk with your health care provider to find out if you are at risk for hepatitis B infection. Hepatitis C Testing is recommended for: Everyone born from [birth year] through 1966. Anyone with known risk factors for hepatitis C. Sexually transmitted infections (STIs) Get screened for STIs, including gonorrhea and chlamydia, if: You are sexually active and are younger than 42 years of age. You are older than 42 years of age and your health care provider tells you that you are at risk for this type of infection. Your sexual activity has changed since you were last screened, and you are at increased risk for chlamydia or gonorrhea. Ask your health care provider if you  are at risk. Ask your health care provider about whether you are at high risk for HIV. Your health care provider may recommend a prescription medicine to help prevent HIV infection. If you choose to take medicine to prevent HIV, you should first get tested for HIV. You should then be tested every 3 months for as long as you are taking the  medicine. Pregnancy If you are about to stop having your period (premenopausal) and you may become pregnant, seek counseling before you get pregnant. Take 400 to 800 micrograms (mcg) of folic acid every day if you become pregnant. Ask for birth control (contraception) if you want to prevent pregnancy. Osteoporosis and menopause Osteoporosis is a disease in which the bones lose minerals and strength with aging. This can result in bone fractures. If you are 15 years old or older, or if you are at risk for osteoporosis and fractures, ask your health care provider if you should: Be screened for bone loss. Take a calcium or vitamin D supplement to lower your risk of fractures. Be given hormone replacement therapy (HRT) to treat symptoms of menopause. Follow these instructions at home: Alcohol use Do not drink alcohol if: Your health care provider tells you not to drink. You are pregnant, may be pregnant, or are planning to become pregnant. If you drink alcohol: Limit how much you have to: 0-1 drink a day. Know how much alcohol is in your drink. In the [place] one drink equals one 12 oz bottle of beer (355 mL), one 5 oz glass of wine (148 mL), or one 1 oz glass of hard liquor (44 mL). Lifestyle Do not use any products that contain nicotine or tobacco. These products include cigarettes, chewing tobacco, and vaping devices, such as e-cigarettes. If you need help quitting, ask your health care provider. Do not use street drugs. Do not share needles. Ask your health care provider for help if you need support or information about quitting drugs. General instructions Schedule regular health, dental, and eye exams. Stay current with your vaccines. Tell your health care provider if: You often feel depressed. You have ever been abused or do not feel safe at home. Summary Adopting a healthy lifestyle and getting preventive care are important in promoting health and wellness. Follow your health care  provider's instructions about healthy diet, exercising, and getting tested or screened for diseases. Follow your health care provider's instructions on monitoring your cholesterol and blood pressure. This information is not intended to replace advice given to you by your health care provider. Make sure you discuss any questions you have with your health care provider. Document Revised: 10/31/2020 Document Reviewed: 10/31/2020 Elsevier Patient Education  [place].

## 2022-06-11 NOTE — Assessment & Plan Note (Addendum)
Chronic, stable period on low dose wellbutrin - continue this. Rare ativan use.

## 2022-06-11 NOTE — Progress Notes (Signed)
Patient ID: Sherry Lara, female    DOB: June 03, 1981, 41 y.o.   MRN: [id number]  This visit was conducted in person.  BP 134/82   Pulse 86   Temp 97.9 F (36.6 C) (Temporal)   Ht 5' 4.5" (1.638 m)   Wt 161 lb (73 kg)   LMP 05/01/2022   SpO2 98%   BMI 27.21 kg/m    CC: CPE Subjective:   HPI: Sherry Lara is a 41 y.o. female presenting on 06/11/2022 for Annual Exam   C diff infection 01/2021 - treated with oral vancomycin course with resolution. No recurrent diarrheal illness.   6 mo h/o discomfort to left hip associated with clicking when walking. Points to groin area. No inciting trauma/injury or falls. No back pain or lateral hip pain. Tender soreness remains.   Preventative: Requests yearly physical for insurance, will bring form  [facility] - yearly with Dr Greggory Keen and Doreene Burke CNM [facility], normal paps, last seen 02/2022.  Flu shot yearly COVID vaccine - Pfizer 2/201, 08/2019, booster 03/2020 and 2 more Tetanus - 06/2014 Seat belt use discussed Sunscreen use discussed. No changing moles on skin. Sees derm yearly (Dr Adolphus Birchwood).  Sleep - averaging 5-8 hours/night  Non smoker Alcohol - occasional  Dentist q6 mo Eye exam yearly   Lives with husband and 3 children, 1 dog  Occupation: psychologist/counselor works at [employer]  Edu: [employer]  Activity: weight lifting, power yoga  Diet: good water, fruits/vegetables daily. One daughter is vegetarian      Relevant past medical, surgical, family and social history reviewed and updated as indicated. Interim medical history since our last visit reviewed. Allergies and medications reviewed and updated. Outpatient Medications Prior to Visit  Medication Sig Dispense Refill   buPROPion (WELLBUTRIN SR) 150 MG 12 hr tablet TAKE 1 TABLET BY MOUTH EVERY DAY 90 tablet 0   LORazepam (ATIVAN) 0.5 MG tablet Take 1 tablet (0.5 mg total) by mouth 2 (two) times daily as needed for anxiety. 30 tablet 0   No  facility-administered medications prior to visit.     Per HPI unless specifically indicated in ROS section below Review of Systems  Constitutional:  Negative for activity change, appetite change, chills, fatigue, fever and unexpected weight change.  HENT:  Negative for hearing loss.   Eyes:  Negative for visual disturbance.  Respiratory:  Negative for cough, chest tightness, shortness of breath and wheezing.   Cardiovascular:  Negative for chest pain, palpitations and leg swelling.  Gastrointestinal:  Positive for blood in stool (h/o hemorrhoids). Negative for abdominal distention, abdominal pain, constipation, diarrhea, nausea and vomiting.  Genitourinary:  Negative for difficulty urinating and hematuria.  Musculoskeletal:  Negative for arthralgias, myalgias and neck pain.  Skin:  Negative for rash.  Neurological:  Negative for dizziness, seizures, syncope and headaches.  Hematological:  Negative for adenopathy. Does not bruise/bleed easily.  Psychiatric/Behavioral:  Negative for dysphoric mood. The patient is not nervous/anxious.     Objective:  BP 134/82   Pulse 86   Temp 97.9 F (36.6 C) (Temporal)   Ht 5' 4.5" (1.638 m)   Wt 161 lb (73 kg)   LMP 05/01/2022   SpO2 98%   BMI 27.21 kg/m   Wt Readings from Last 3 Encounters:  06/11/22 161 lb (73 kg)  03/01/22 158 lb 11.2 oz (72 kg)  03/02/21 152 lb 12 oz (69.3 kg)      Physical Exam Vitals and nursing note reviewed.  Constitutional:  Appearance: Normal appearance. She is not ill-appearing.  HENT:     Head: Normocephalic and atraumatic.     Right Ear: Tympanic membrane, ear canal and external ear normal. There is no impacted cerumen.     Left Ear: Tympanic membrane, ear canal and external ear normal. There is no impacted cerumen.  Eyes:     General:        Right eye: No discharge.        Left eye: No discharge.     Extraocular Movements: Extraocular movements intact.     Conjunctiva/sclera: Conjunctivae normal.      Pupils: Pupils are equal, round, and reactive to light.  Neck:     Thyroid: No thyroid mass or thyromegaly.  Cardiovascular:     Rate and Rhythm: Normal rate and regular rhythm.     Pulses: Normal pulses.     Heart sounds: Normal heart sounds. No murmur heard. Pulmonary:     Effort: Pulmonary effort is normal. No respiratory distress.     Breath sounds: Normal breath sounds. No wheezing, rhonchi or rales.  Abdominal:     General: Bowel sounds are normal. There is no distension.     Palpations: Abdomen is soft. There is no mass.     Tenderness: There is no abdominal tenderness. There is no guarding or rebound.     Hernia: No hernia is present.  Musculoskeletal:     Cervical back: Normal range of motion and neck supple. No rigidity.     Right lower leg: No edema.     Left lower leg: No edema.     Comments:  Neg SLR bilaterally. No pain with int/ext rotation at hip. Neg FABER. No pain at GTB bilaterally  Lymphadenopathy:     Cervical: No cervical adenopathy.  Skin:    General: Skin is warm and dry.     Findings: No rash.  Neurological:     General: No focal deficit present.     Mental Status: She is alert. Mental status is at baseline.  Psychiatric:        Mood and Affect: Mood normal.        Behavior: Behavior normal.       Results for orders placed or performed in visit on 06/04/22  Basic metabolic panel  Result Value Ref Range   Sodium 138 135 - 145 mEq/L   Potassium 3.9 3.5 - 5.1 mEq/L   Chloride 101 96 - 112 mEq/L   CO2 29 19 - 32 mEq/L   Glucose, Bld 97 70 - 99 mg/dL   BUN 14 6 - 23 mg/dL   Creatinine, Ser 3.71 0.40 - 1.20 mg/dL   GFR 06.26 >94.85 mL/min   Calcium 9.9 8.4 - 10.5 mg/dL  Lipid panel  Result Value Ref Range   Cholesterol 207 (H) 0 - 200 mg/dL   Triglycerides 46.2 0.0 - 149.0 mg/dL   HDL 70.35 >00.93 mg/dL   VLDL 81.8 0.0 - 29.9 mg/dL   LDL Cholesterol 371 (H) 0 - 99 mg/dL   Total CHOL/HDL Ratio 3    NonHDL 128.30     Assessment & Plan:    Problem List Items Addressed This Visit     Health maintenance examination - Primary (Chronic)    Preventative protocols reviewed and updated unless pt declined. Discussed healthy diet and lifestyle.       Moderate episode of recurrent major depressive disorder (HCC)    Stable period on low dose wellbutrin - continue.  Relevant Medications   buPROPion (WELLBUTRIN SR) 150 MG 12 hr tablet   LORazepam (ATIVAN) 0.5 MG tablet   Panic attacks   Relevant Medications   buPROPion (WELLBUTRIN SR) 150 MG 12 hr tablet   LORazepam (ATIVAN) 0.5 MG tablet   GAD (generalized anxiety disorder)    Chronic, stable period on low dose wellbutrin - continue this. Rare ativan use.       Relevant Medications   buPROPion (WELLBUTRIN SR) 150 MG 12 hr tablet   LORazepam (ATIVAN) 0.5 MG tablet   Clicking of left hip    Describes soreness of left hip located at groin associated with clicking sensation. Reassuring exam today. ?clicking hip syndrome - provided with exercises from [facility] pt advisor. Advised let us know if ongoing for xrays and consideration for sports med evaluation vs PT course.       Hyperlipidemia    Mild, reviewed diet choices to improve cholesterol levels. She does note fmhx HLD. The 10-year ASCVD risk score (Arnett DK, et al., 2019) is: 0.4%   Values used to calculate the score:     Age: 52 years     Sex: Female     Is Non-Hispanic African American: No     Diabetic: No     Tobacco smoker: No     Systolic Blood Pressure: 134 mmHg     Is BP treated: No     HDL Cholesterol: 78.8 mg/dL     Total Cholesterol: 207 mg/dL         Meds ordered this encounter  Medications   buPROPion (WELLBUTRIN SR) 150 MG 12 hr tablet    Sig: Take 1 tablet (150 mg total) by mouth daily.    Dispense:  90 tablet    Refill:  4   LORazepam (ATIVAN) 0.5 MG tablet    Sig: Take 1 tablet (0.5 mg total) by mouth 2 (two) times daily as needed for anxiety.    Dispense:  30 tablet    Refill:  1   No  orders of the defined types were placed in this encounter.    Patient instructions: Good to see you today. Work on fiber and legumes in the diet to keep LDL cholesterol under control. Return as needed or in 1 year for next physical.  Follow up plan: Return in about 1 year (around 06/12/2023) for annual exam, prior fasting for blood work.  Eustaquio Boyden, MD

## 2022-06-11 NOTE — Assessment & Plan Note (Signed)
Preventative protocols reviewed and updated unless pt declined. Discussed healthy diet and lifestyle.  

## 2022-06-29 ENCOUNTER — Telehealth: Payer: BC Managed Care – PPO | Admitting: Family Medicine

## 2022-06-29 DIAGNOSIS — R6889 Other general symptoms and signs: Secondary | ICD-10-CM

## 2022-06-29 MED ORDER — OSELTAMIVIR PHOSPHATE 75 MG PO CAPS: 75.0000 mg | ORAL_CAPSULE | Freq: Two times a day (BID) | ORAL | 0 refills | Status: AC

## 2022-06-29 MED ORDER — ONDANSETRON HCL 4 MG PO TABS: 4.0000 mg | ORAL_TABLET | Freq: Three times a day (TID) | ORAL | 0 refills | Status: DC | PRN

## 2022-06-29 NOTE — Patient Instructions (Signed)

## 2022-06-29 NOTE — Progress Notes (Signed)
Virtual Visit Consent   Sherry Lara, you are scheduled for a virtual visit with a [facility] provider today. Just as with appointments in the office, your consent must be obtained to participate. Your consent will be active for this visit and any virtual visit you may have with one of our providers in the next 365 days. If you have a MyChart account, a copy of this consent can be sent to you electronically.  As this is a virtual visit, video technology does not allow for your provider to perform a traditional examination. This may limit your provider's ability to fully assess your condition. If your provider identifies any concerns that need to be evaluated in person or the need to arrange testing (such as labs, EKG, etc.), we will make arrangements to do so. Although advances in technology are sophisticated, we cannot ensure that it will always work on either your end or our end. If the connection with a video visit is poor, the visit may have to be switched to a telephone visit. With either a video or telephone visit, we are not always able to ensure that we have a secure connection.  By engaging in this virtual visit, you consent to the provision of healthcare and authorize for your insurance to be billed (if applicable) for the services provided during this visit. Depending on your insurance coverage, you may receive a charge related to this service.  I need to obtain your verbal consent now. Are you willing to proceed with your visit today? Sherry Lara has provided verbal consent on 06/29/2022 for a virtual visit (video or telephone). Dellia Nims, FNP  Date: 06/29/2022 7:06 PM  Virtual Visit via Video Note   I, Dellia Nims, connected with  Sherry Lara  ([id number], [birth year]) on 06/29/22 at  7:15 PM EST by a video-enabled telemedicine application and verified that I am speaking with the correct person using two identifiers.  Location: Patient: Virtual  Visit Location Patient: Home Provider: Virtual Visit Location Provider: Home Office   I discussed the limitations of evaluation and management by telemedicine and the availability of in person appointments. The patient expressed understanding and agreed to proceed.    History of Present Illness: Sherry Lara is a 42 y.o. who identifies as a female who was assigned female at birth, and is being seen today for flu like symptoms with neg covid test. She reports bldy aches, fever, chills, nausea and diarrhea with a headache. No cough yet. Sx started this am. .  HPI: HPI  Problems:  Patient Active Problem List   Diagnosis Date Noted   Clicking of left hip 57/84/6962   Hyperlipidemia 06/11/2022   C. difficile colitis 02/10/2021   GAD (generalized anxiety disorder) 07/19/2020   Moderate episode of recurrent major depressive disorder ([place]) 03/29/2017   Panic attacks 03/29/2017   History of C-section 01/02/2017   TMJ dysfunction 03/27/2016   Health maintenance examination 03/25/2015    Allergies:  Allergies  Allergen Reactions   Vicodin [Hydrocodone-Acetaminophen] Nausea And Vomiting   Codeine Rash   Penicillins Rash   Medications:  Current Outpatient Medications:    ondansetron (ZOFRAN) 4 MG tablet, Take 1 tablet (4 mg total) by mouth every 8 (eight) hours as needed for nausea or vomiting., Disp: 20 tablet, Rfl: 0   oseltamivir (TAMIFLU) 75 MG capsule, Take 1 capsule (75 mg total) by mouth 2 (two) times daily for 5 days., Disp: 10 capsule, Rfl: 0  buPROPion (WELLBUTRIN SR) 150 MG 12 hr tablet, Take 1 tablet (150 mg total) by mouth daily., Disp: 90 tablet, Rfl: 4   LORazepam (ATIVAN) 0.5 MG tablet, Take 1 tablet (0.5 mg total) by mouth 2 (two) times daily as needed for anxiety., Disp: 30 tablet, Rfl: 1  Observations/Objective: Patient is well-developed, well-nourished in no acute distress.  Resting comfortably  at home.  Head is normocephalic, atraumatic.  No labored  breathing.  Speech is clear and coherent with logical content.  Patient is alert and oriented at baseline.    Assessment and Plan: 1. Flu-like symptoms  Increase fluids, no milk or dairy, rest, humidifier at night, tylenol, urgetn care if sx worsen.  Follow Up Instructions: I discussed the assessment and treatment plan with the patient. The patient was provided an opportunity to ask questions and all were answered. The patient agreed with the plan and demonstrated an understanding of the instructions.  A copy of instructions were sent to the patient via MyChart unless otherwise noted below.     The patient was advised to call back or seek an in-person evaluation if the symptoms worsen or if the condition fails to improve as anticipated.  Time:  I spent 10 minutes with the patient via telehealth technology discussing the above problems/concerns.    Dellia Nims, FNP

## 2022-07-04 NOTE — Progress Notes (Signed)
Sherry Estey T. Lee-Ann Gal, MD, [facility] [place], [address]  Phone: [phone]  FAX: [phone]  Sherry Lara - 42 y.o. female  MRN [id number]  Date of Birth: [birth year]  Date: 07/05/2022  PCP: Ria Bush, MD  Referral: Ria Bush, MD  Chief Complaint  Patient presents with   Hip Pain    Left   Numbness    Left Hand   Subjective:   Sherry Lara is a 42 y.o. very pleasant female patient with Body mass index is 27.57 kg/m. who presents with the following:  Pleasant patient presents with left-sided hand numbness as well as some left hip pain.  I actually saw her in September 2022 with some lumbar radiculopathy.  Last time she was in the office, gave her an IM Toradol as well as an IM Decadron injection.  L hip is clinicking and will be sore at times.  She points of maximal tenderness of the left anterior groin.  She has a positive C sign.  Has been continuing for about 6 months.  Will notice when she is more active.  Pain in the groin.  Does a lot of yoga and it will feel a bit like it is less mobile.  She will have a deep dull ache at times, and she feels as if she is little bit weaker and stiffer when she is trying to do her yoga. - no injuries, showed up in July.   For about a month, hand started to feel numb.  Would move around and it was numb some during the day.  This is all on the left side.  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  She is on the computer a lot of the day.  She does not have any elbow pain, she denies any neck pain.  She is not having any whole arm radiculopathy, or weakness.  CTS  Review of Systems is noted in the HPI, as appropriate  Objective:   BP 100/70   Pulse 71   Temp 98.3 F (36.8 C) (Oral)   Ht 5' 4.5" (1.638 m)   Wt 163 lb 2 oz (74 kg)   LMP 05/01/2022   SpO2 98%   BMI 27.57 kg/m    GEN: No acute distress; alert,appropriate. PULM: Breathing comfortably in no respiratory distress PSYCH: Normally interactive.   Full range of motion at the neck without any inducible radiculopathy or change in sensation with neck movement. Tinel's at the elbow is negative.  Hand: L Ecchymosis or edema: neg ROM wrist/hand/digits/elbow: full  Carpals, MCP's, digits: NT Distal Ulna and Radius: NT Supination lift test: neg Ecchymosis or edema: neg Cysts/nodules: neg Finkelstein's test: neg Snuffbox tenderness: neg Scaphoid tubercle: NT Hook of Hamate: NT Resisted supination: NT Full composite fist Grip, all digits: 5/5 str No tenosynovitis Axial load test: neg Phalen's: neg Tinel's: neg Atrophy: neg  Hand sensation: intact    HIP EXAM: SIDE: L ROM: Abduction, Flexion, Internal and External range of motion: Full Pain with terminal IROM and EROM: Extreme ends of FADIR is positive GTB: NT SLR: NEG Knees: No effusion FABER: NT REVERSE FABER: NT, neg Piriformis: NT at direct palpation Str: flexion: 5/5 abduction: 5/5 adduction: 5/5 Strength testing non-tender    Laboratory and Imaging Data:  Assessment and Plan:     ICD-10-CM   1. Chronic left hip pain  M25.552    G89.29     2. Numbness and tingling in left hand  R20.0    R20.2     3. Carpal tunnel syndrome of left wrist  G56.02      Her hip exam is relatively reassuring.  She does have some pain with terminal internal range of motion, suggesting some intra-articular hip irritation.  Doubtful arthritic changes at 41.  I am going to place her on some scheduled Celebrex.  By history, this does seem to be like some mild carpal tunnel syndrome, but noninducible on exam.  For now, I am going to have her use a carpal tunnel splint at nighttime.  Medication Management during today's office visit: Meds ordered this encounter  Medications   celecoxib (CELEBREX) 200 MG capsule    Sig: Take 1 capsule (200 mg  total) by mouth daily.    Dispense:  30 capsule    Refill:  2   Medications Discontinued During This Encounter  Medication Reason   ondansetron (ZOFRAN) 4 MG tablet No longer needed (for PRN medications)    Orders placed today for conditions managed today: No orders of the defined types were placed in this encounter.   Disposition: No follow-ups on file.  [employer] One speech-to-text software was used for transcription in this dictation.  Possible transcriptional errors can occur using Editor, commissioning.   Signed,  Maud Deed. Lissete Maestas, MD   Outpatient Encounter Medications as of 07/05/2022  Medication Sig   buPROPion (WELLBUTRIN SR) 150 MG 12 hr tablet Take 1 tablet (150 mg total) by mouth daily.   celecoxib (CELEBREX) 200 MG capsule Take 1 capsule (200 mg total) by mouth daily.   LORazepam (ATIVAN) 0.5 MG tablet Take 1 tablet (0.5 mg total) by mouth 2 (two) times daily as needed for anxiety.   [DISCONTINUED] ondansetron (ZOFRAN) 4 MG tablet Take 1 tablet (4 mg total) by mouth every 8 (eight) hours as needed for nausea or vomiting.   [EXPIRED] oseltamivir (TAMIFLU) 75 MG capsule Take 1 capsule (75 mg total) by mouth 2 (two) times daily for 5 days.   No facility-administered encounter medications on file as of 07/05/2022.

## 2022-07-05 ENCOUNTER — Encounter: Payer: Self-pay | Admitting: Family Medicine

## 2022-07-05 ENCOUNTER — Ambulatory Visit (INDEPENDENT_AMBULATORY_CARE_PROVIDER_SITE_OTHER): Payer: BC Managed Care – PPO | Admitting: Family Medicine

## 2022-07-05 VITALS — BP 100/70 | HR 71 | Temp 98.3°F | Ht 64.5 in | Wt 163.1 lb

## 2022-07-05 DIAGNOSIS — G8929 Other chronic pain: Secondary | ICD-10-CM

## 2022-07-05 DIAGNOSIS — G5602 Carpal tunnel syndrome, left upper limb: Secondary | ICD-10-CM

## 2022-07-05 DIAGNOSIS — R202 Paresthesia of skin: Secondary | ICD-10-CM

## 2022-07-05 DIAGNOSIS — R200 Anesthesia of skin: Secondary | ICD-10-CM

## 2022-07-05 DIAGNOSIS — M25552 Pain in left hip: Secondary | ICD-10-CM

## 2022-07-05 DIAGNOSIS — M5442 Lumbago with sciatica, left side: Secondary | ICD-10-CM

## 2022-07-05 MED ORDER — CELECOXIB 200 MG PO CAPS: 200.0000 mg | ORAL_CAPSULE | Freq: Every day | ORAL | 2 refills | Status: DC

## 2022-07-08 ENCOUNTER — Encounter: Payer: Self-pay | Admitting: Family Medicine

## 2022-11-27 ENCOUNTER — Other Ambulatory Visit (HOSPITAL_COMMUNITY)
Admission: RE | Admit: 2022-11-27 | Discharge: 2022-11-27 | Disposition: A | Discharge status: Home / Self Care | Payer: BC Managed Care – PPO | Source: Ambulatory Visit | Attending: Certified Nurse Midwife | Admitting: Certified Nurse Midwife

## 2022-11-27 ENCOUNTER — Ambulatory Visit (INDEPENDENT_AMBULATORY_CARE_PROVIDER_SITE_OTHER): Payer: BC Managed Care – PPO

## 2022-11-27 VITALS — BP 123/88 | Ht 65.0 in | Wt 155.0 lb

## 2022-11-27 DIAGNOSIS — N898 Other specified noninflammatory disorders of vagina: Secondary | ICD-10-CM | POA: Insufficient documentation

## 2022-11-27 NOTE — Progress Notes (Signed)
    NURSE VISIT NOTE  Subjective:    Patient ID: Sherry Lara, female    DOB: April 27, 1981, 42 y.o.   MRN: [id number]  HPI  Patient is a 42 y.o. G54P2013 female who presents for  vaginal itching and discomfort  for 2 week(s). Denies abnormal vaginal bleeding or significant pelvic pain or fever. denies UTI SX. Patient denies history of known exposure to STD.    Assessment:   1. Vaginal itching       Plan:   GC and chlamydia DNA  probe sent to lab. Treatment: abstain from coitus during course of treatment ROV prn if symptoms persist or worsen.   Loney Laurence, CMA

## 2022-11-29 LAB — CERVICOVAGINAL ANCILLARY ONLY
Bacterial Vaginitis (gardnerella): NEGATIVE
Candida Glabrata: NEGATIVE
Candida Vaginitis: NEGATIVE
Comment: NEGATIVE
Comment: NEGATIVE
Comment: NEGATIVE

## 2023-03-13 IMAGING — MG MM DIGITAL SCREENING BILAT W/ TOMO AND CAD
8 series · 9 of 24 positions shown · non-contrast
Comparison: None.

CLINICAL DATA: Screening.

EXAM:
DIGITAL SCREENING BILATERAL MAMMOGRAM WITH TOMOSYNTHESIS AND CAD
TECHNIQUE: Bilateral screening digital craniocaudal and mediolateral oblique
mammograms were obtained. Bilateral screening digital breast
tomosynthesis was performed. The images were evaluated with
computer-aided detection.

[R CC synth-2D]
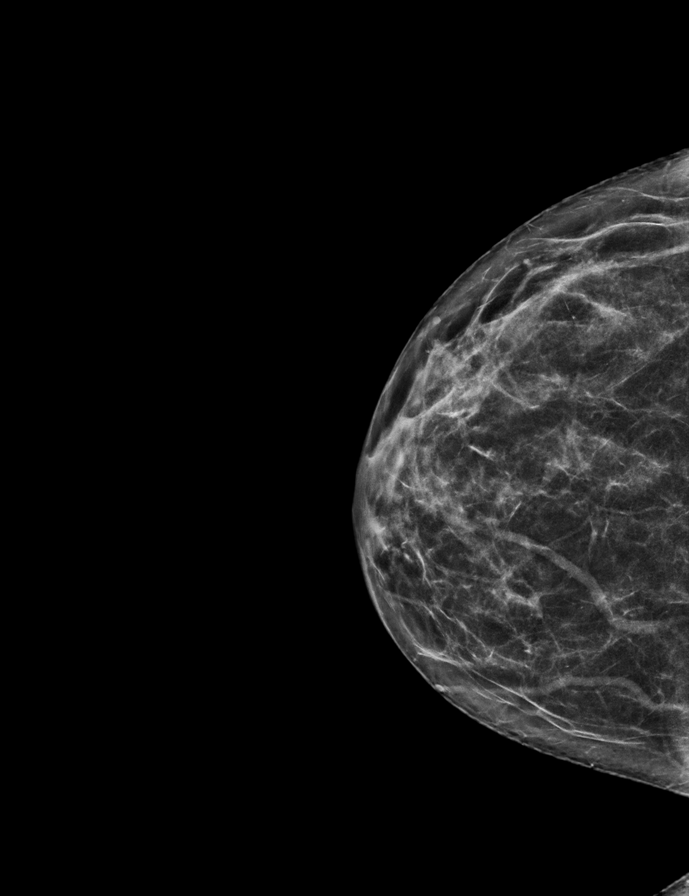

[R MLO synth-2D]
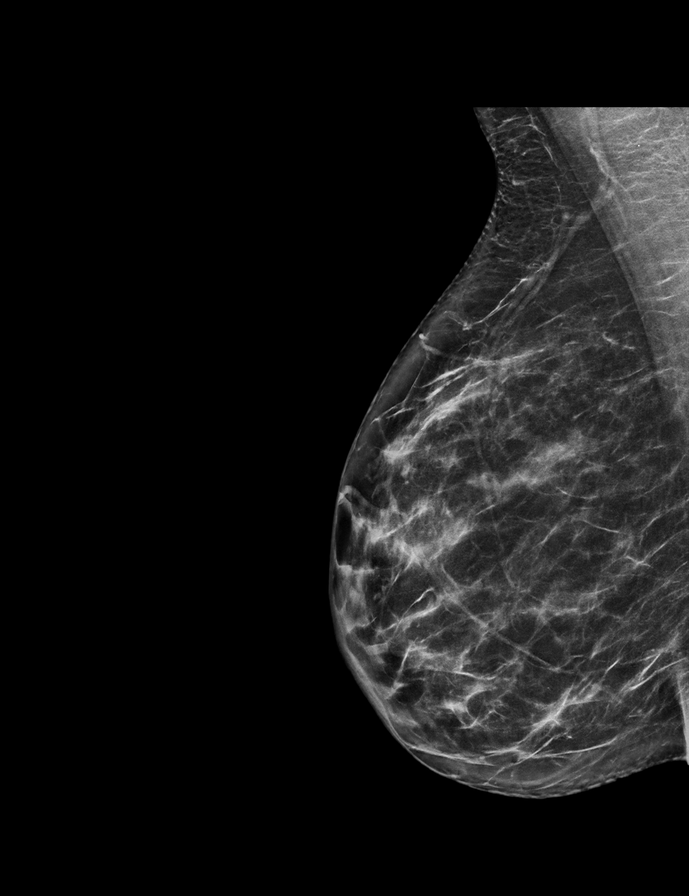

[L CC synth-2D]
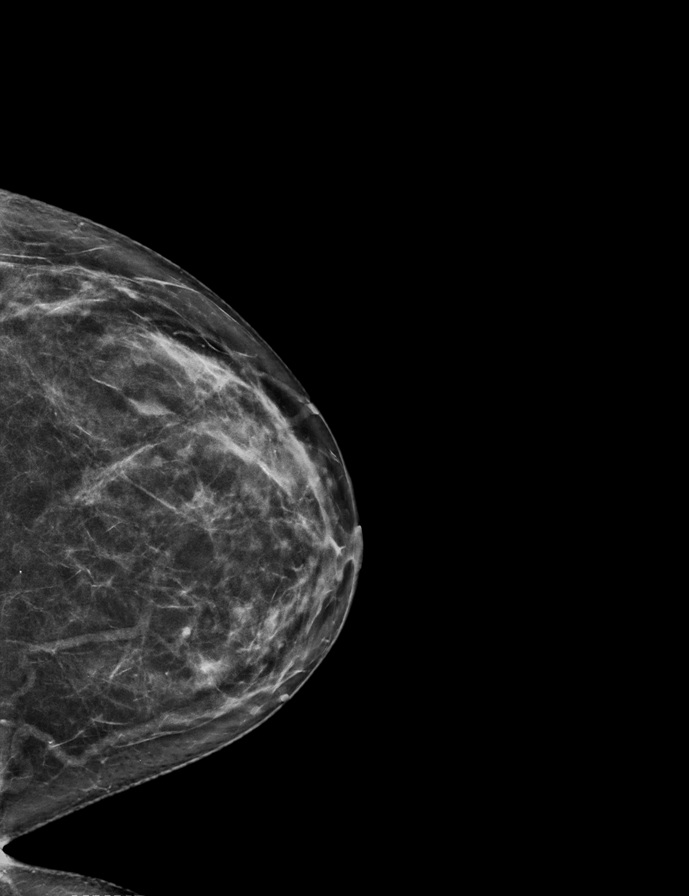

[L MLO synth-2D]
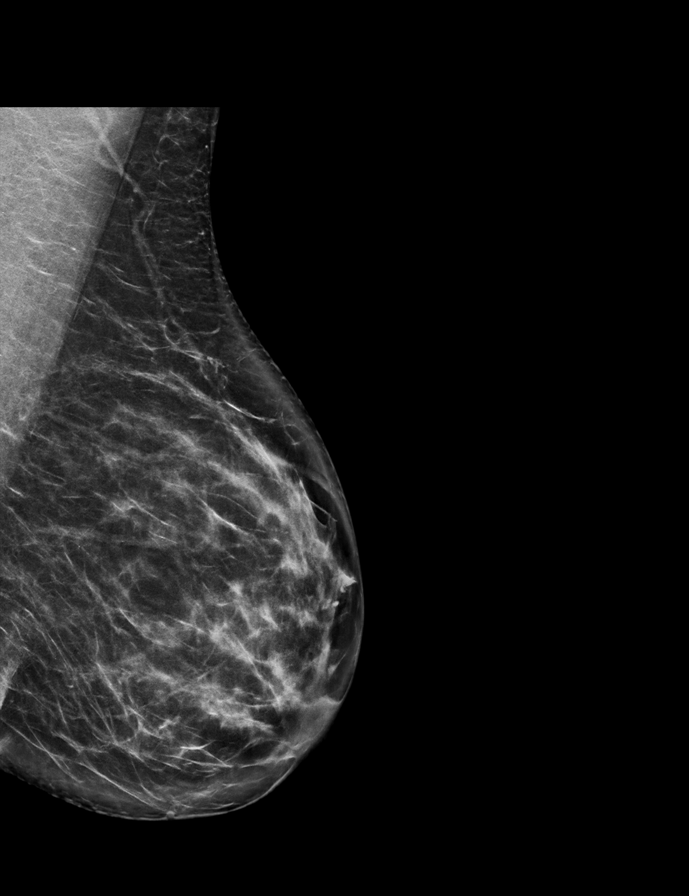

[L CC tomo · 2 of 65 frames shown]
[frame 21/65]
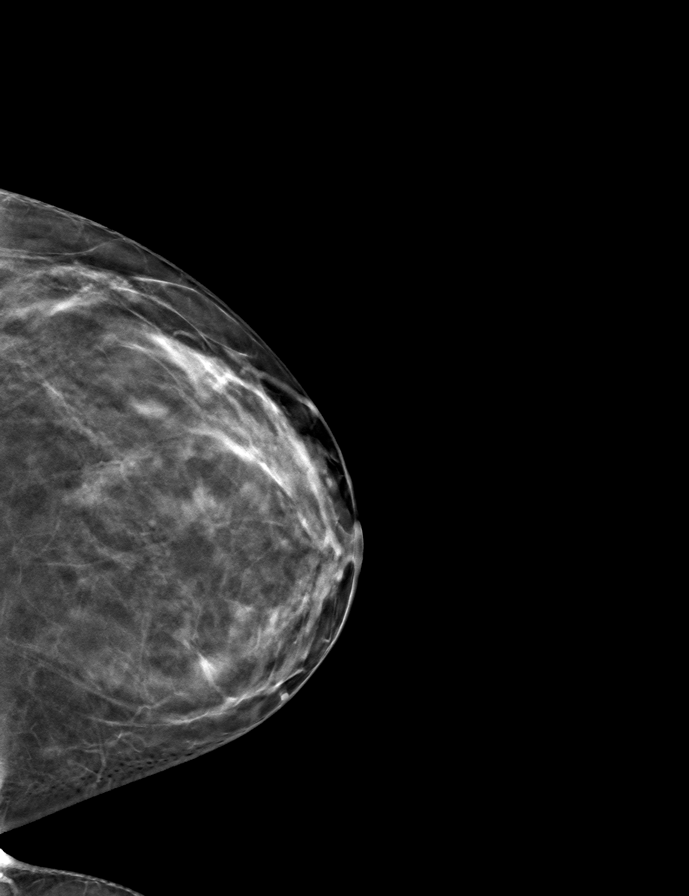
[frame 33/65]
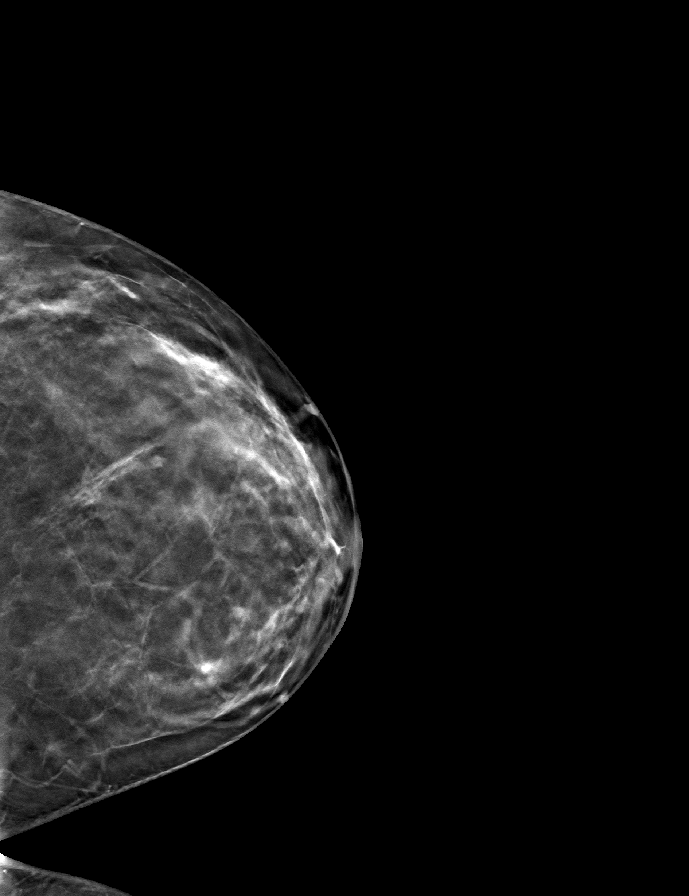

[R CC tomo · tomo slice 33/64.0]
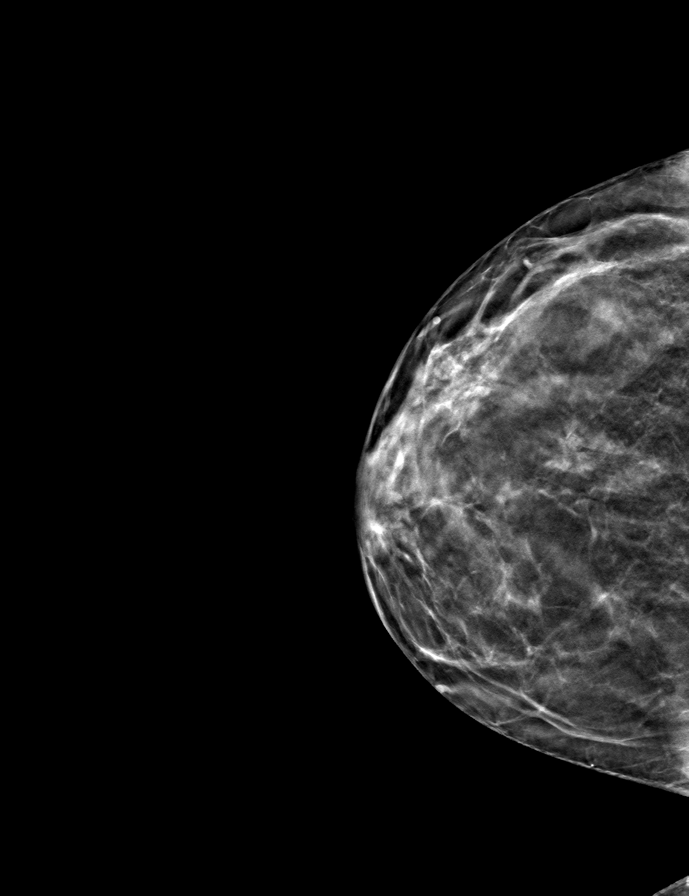

[L MLO tomo · tomo slice 37/74.0]
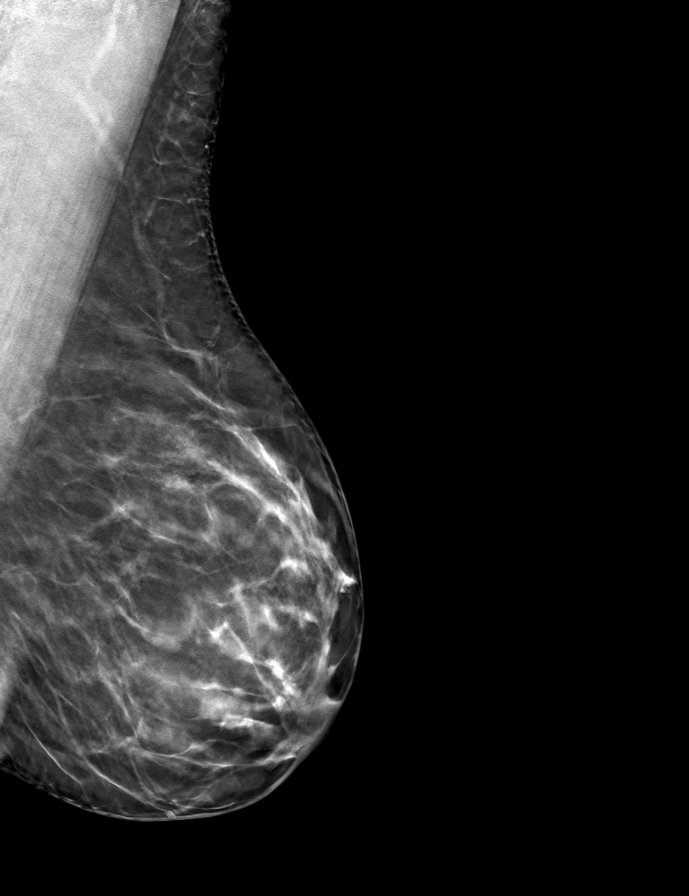

[R MLO tomo · tomo slice 35/68.0]
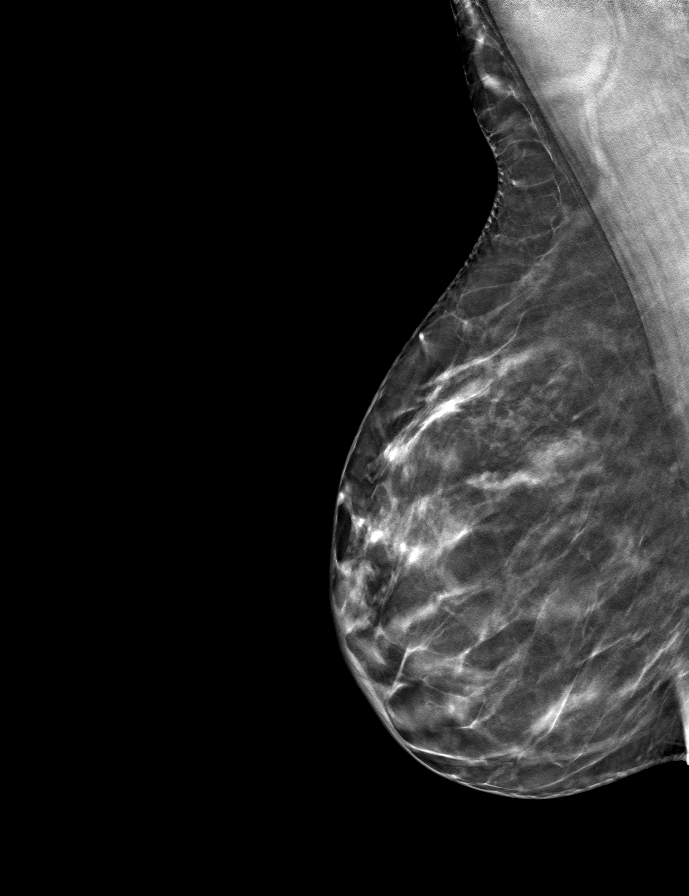

[9 of 24 positions shown; findings below may reference images not displayed]

ACR Breast Density Category c: The breast tissue is heterogeneously
dense, which may obscure small masses
FINDINGS: There are no findings suspicious for malignancy.
IMPRESSION: No mammographic evidence of malignancy. A result letter of this
screening mammogram will be mailed directly to the patient.

RECOMMENDATION:
Screening mammogram in one year. (Code:C8-T-HNK)

BI-RADS CATEGORY  1: Negative.

## 2023-03-20 ENCOUNTER — Other Ambulatory Visit: Payer: Self-pay | Admitting: Certified Nurse Midwife

## 2023-03-20 DIAGNOSIS — Z1231 Encounter for screening mammogram for malignant neoplasm of breast: Secondary | ICD-10-CM

## 2023-04-26 ENCOUNTER — Encounter: Payer: Self-pay | Admitting: Radiology

## 2023-04-26 ENCOUNTER — Ambulatory Visit
Admission: RE | Admit: 2023-04-26 | Discharge: 2023-04-26 | Disposition: A | Discharge status: Home / Self Care | Payer: BC Managed Care – PPO | Source: Ambulatory Visit | Attending: Certified Nurse Midwife | Admitting: Certified Nurse Midwife

## 2023-04-26 DIAGNOSIS — Z1231 Encounter for screening mammogram for malignant neoplasm of breast: Secondary | ICD-10-CM | POA: Diagnosis present

## 2023-05-09 ENCOUNTER — Ambulatory Visit: Payer: BC Managed Care – PPO | Admitting: Certified Nurse Midwife

## 2023-05-09 ENCOUNTER — Encounter: Payer: Self-pay | Admitting: Certified Nurse Midwife

## 2023-05-09 VITALS — BP 157/91 | HR 71 | Ht 64.0 in | Wt 163.2 lb

## 2023-05-09 DIAGNOSIS — Z13220 Encounter for screening for lipoid disorders: Secondary | ICD-10-CM

## 2023-05-09 DIAGNOSIS — Z131 Encounter for screening for diabetes mellitus: Secondary | ICD-10-CM

## 2023-05-09 DIAGNOSIS — Z23 Encounter for immunization: Secondary | ICD-10-CM | POA: Diagnosis not present

## 2023-05-09 DIAGNOSIS — Z1231 Encounter for screening mammogram for malignant neoplasm of breast: Secondary | ICD-10-CM

## 2023-05-09 DIAGNOSIS — Z01419 Encounter for gynecological examination (general) (routine) without abnormal findings: Secondary | ICD-10-CM

## 2023-05-09 DIAGNOSIS — N951 Menopausal and female climacteric states: Secondary | ICD-10-CM

## 2023-05-09 MED ORDER — NORETHINDRONE ACET-ETHINYL EST 1.5-30 MG-MCG PO TABS: 1.0000 | ORAL_TABLET | Freq: Every day | ORAL | 11 refills | Status: DC

## 2023-05-09 NOTE — Progress Notes (Addendum)
GYNECOLOGY ANNUAL PREVENTATIVE CARE ENCOUNTER NOTE  History:     Sherry Lara is a 42 y.o. [phone] female here for a routine annual gynecologic exam.  Current complaints: perimenopausal symptoms ( hot flashes, mood changes and sleep disturbance), has not had a period since August.   Right sided pelvic pain .  Denies abnormal vaginal bleeding, discharge, problems with intercourse or other gynecologic concerns.     Social Relationship: married  Living: spouses and children (3) Work: Public house manager Exercise: run/yoga 4x wk Smoke/Alcohol/drug use: alcohol -occasionally   Gynecologic History No LMP recorded. Contraception: vasectomy Last Pap: 03/01/2022 Results were: ASC-US with negative HPV Last mammogram: 04/26/2023. Results were: normal   Obstetric History OB History  Gravida Para Term Preterm AB Living  4 2 2   1 3   SAB IAB Ectopic Multiple Live Births  1       3    # Outcome Date GA Lbr Len/2nd Weight Sex Type Anes PTL Lv  4 SAB           3 Term      CS-LTranv   LIV  2 Term      CS-LTranv   LIV  1 Gravida      CS-LTranv   LIV    Past Medical History:  Diagnosis Date   Acne    Anxiety    Asthma    Depression    GDM (gestational diabetes mellitus)    History of asthma    childhood   History of chicken pox    History of depression    and anxiety; prior on wellbutrin/celexa   Hx of migraines    improved after pregnancies   Migraines    Vaginal irritation     Past Surgical History:  Procedure Laterality Date   ADENOIDECTOMY     CESAREAN SECTION  2008,2009,2013   CHOLECYSTECTOMY  2009   TONSILLECTOMY  ~1992   WISDOM TOOTH EXTRACTION      Current Outpatient Medications on File Prior to Visit  Medication Sig Dispense Refill   buPROPion (WELLBUTRIN SR) 150 MG 12 hr tablet Take 1 tablet (150 mg total) by mouth daily. 90 tablet 4   LORazepam (ATIVAN) 0.5 MG tablet Take 1 tablet (0.5 mg total) by mouth 2 (two) times daily as needed  for anxiety. 30 tablet 1   No current facility-administered medications on file prior to visit.    Allergies  Allergen Reactions   Vicodin [Hydrocodone-Acetaminophen] Nausea And Vomiting   Codeine Rash   Penicillins Rash    Social History:  reports that she has never smoked. She has never used smokeless tobacco. She reports current alcohol use. She reports that she does not use drugs.  Family History  Problem Relation Age of Onset   Hypertension Father    Hyperlipidemia Father        possibly   CAD Neg Hx    Stroke Neg Hx    Diabetes Neg Hx    Cancer Neg Hx     The following portions of the patient's history were reviewed and updated as appropriate: allergies, current medications, past family history, past medical history, past social history, past surgical history and problem list.  Review of Systems Pertinent items noted in HPI and remainder of comprehensive ROS otherwise negative.  Physical Exam:  BP (!) 157/91   Pulse 71   Ht 5\' 4"  (1.626 m)   Wt 163 lb 3.2 oz (74 kg)  BMI 28.01 kg/m  CONSTITUTIONAL: Well-developed, well-nourished female in no acute distress.  HENT:  Normocephalic, atraumatic, External right and left ear normal. Oropharynx is clear and moist EYES: Conjunctivae and EOM are normal. Pupils are equal, round, and reactive to light. No scleral icterus.  NECK: Normal range of motion, supple, no masses.  Normal thyroid.  SKIN: Skin is warm and dry. No rash noted. Not diaphoretic. No erythema. No pallor. MUSCULOSKELETAL: Normal range of motion. No tenderness.  No cyanosis, clubbing, or edema.  2+ distal pulses. NEUROLOGIC: Alert and oriented to person, place, and time. Normal reflexes, muscle tone coordination.  PSYCHIATRIC: Normal mood and affect. Normal behavior. Normal judgment and thought content. CARDIOVASCULAR: Normal heart rate noted, regular rhythm RESPIRATORY: Clear to auscultation bilaterally. Effort and breath sounds normal, no problems with  respiration noted. BREASTS: Symmetric in size. No masses, tenderness, skin changes, nipple drainage, or lymphadenopathy bilaterally.  ABDOMEN: Soft, no distention noted.  No tenderness, rebound or guarding.  PELVIC: Normal appearing external genitalia and urethral meatus; normal appearing vaginal mucosa and cervix.  No abnormal discharge noted.  Pap smear not due.  Normal uterine size, no other palpable masses, no uterine or adnexal tenderness.  .   Assessment and Plan:    Annual [facility] G[facility] exam   Pap:UTD Mammogram :UTD Labs:Pending Refills: OCP( to help with peri menopausal symptoms), a Referral: none Orders - pelvic u/s for right sided pain.  Discussed use of melatonin or Unisom to help with sleep disturbance. Routine preventative health maintenance measures emphasized. Please refer to After Visit Summary for other counseling recommendations.     BP elevated pt has history of white coats , she gets very anxious at doctors appointments. She is tearful today. Repeat remained elevated . Pt advised to take again when at rest and not feeling anxious. Discussed follow up with PCP should if elevated 140/90 or higher.   Doreene Burke, CNM [facility]  [facility],  [facility]

## 2023-05-09 NOTE — Addendum Note (Signed)
Addended by: Mechele Claude on: 05/09/2023 03:13 PM   Modules accepted: Orders

## 2023-05-09 NOTE — Patient Instructions (Signed)
Preventive Care 40-43 Years Old, Female Preventive care refers to lifestyle choices and visits with your health care provider that can promote health and wellness. Preventive care visits are also called wellness exams. What can I expect for my preventive care visit? Counseling Your health care provider may ask you questions about your: Medical history, including: Past medical problems. Family medical history. Pregnancy history. Current health, including: Menstrual cycle. Method of birth control. Emotional well-being. Home life and relationship well-being. Sexual activity and sexual health. Lifestyle, including: Alcohol, nicotine or tobacco, and drug use. Access to firearms. Diet, exercise, and sleep habits. Work and work environment. Sunscreen use. Safety issues such as seatbelt and bike helmet use. Physical exam Your health care provider will check your: Height and weight. These may be used to calculate your BMI (body mass index). BMI is a measurement that tells if you are at a healthy weight. Waist circumference. This measures the distance around your waistline. This measurement also tells if you are at a healthy weight and may help predict your risk of certain diseases, such as type 2 diabetes and high blood pressure. Heart rate and blood pressure. Body temperature. Skin for abnormal spots. What immunizations do I need?  Vaccines are usually given at various ages, according to a schedule. Your health care provider will recommend vaccines for you based on your age, medical history, and lifestyle or other factors, such as travel or where you work. What tests do I need? Screening Your health care provider may recommend screening tests for certain conditions. This may include: Lipid and cholesterol levels. Diabetes screening. This is done by checking your blood sugar (glucose) after you have not eaten for a while (fasting). Pelvic exam and Pap test. Hepatitis B test. Hepatitis C  test. HIV (human immunodeficiency virus) test. STI (sexually transmitted infection) testing, if you are at risk. Lung cancer screening. Colorectal cancer screening. Mammogram. Talk with your health care provider about when you should start having regular mammograms. This may depend on whether you have a family history of breast cancer. BRCA-related cancer screening. This may be done if you have a family history of breast, ovarian, tubal, or peritoneal cancers. Bone density scan. This is done to screen for osteoporosis. Talk with your health care provider about your test results, treatment options, and if necessary, the need for more tests. Follow these instructions at home: Eating and drinking  Eat a diet that includes fresh fruits and vegetables, whole grains, lean protein, and low-fat dairy products. Take vitamin and mineral supplements as recommended by your health care provider. Do not drink alcohol if: Your health care provider tells you not to drink. You are pregnant, may be pregnant, or are planning to become pregnant. If you drink alcohol: Limit how much you have to 0-1 drink a day. Know how much alcohol is in your drink. In the [place] one drink equals one 12 oz bottle of beer (355 mL), one 5 oz glass of wine (148 mL), or one 1 oz glass of hard liquor (44 mL). Lifestyle Brush your teeth every morning and night with fluoride toothpaste. Floss one time each day. Exercise for at least 30 minutes 5 or more days each week. Do not use any products that contain nicotine or tobacco. These products include cigarettes, chewing tobacco, and vaping devices, such as e-cigarettes. If you need help quitting, ask your health care provider. Do not use drugs. If you are sexually active, practice safe sex. Use a condom or other form of protection to   prevent STIs. If you do not wish to become pregnant, use a form of birth control. If you plan to become pregnant, see your health care provider for a  prepregnancy visit. Take aspirin only as told by your health care provider. Make sure that you understand how much to take and what form to take. Work with your health care provider to find out whether it is safe and beneficial for you to take aspirin daily. Find healthy ways to manage stress, such as: Meditation, yoga, or listening to music. Journaling. Talking to a trusted person. Spending time with friends and family. Minimize exposure to UV radiation to reduce your risk of skin cancer. Safety Always wear your seat belt while driving or riding in a vehicle. Do not drive: If you have been drinking alcohol. Do not ride with someone who has been drinking. When you are tired or distracted. While texting. If you have been using any mind-altering substances or drugs. Wear a helmet and other protective equipment during sports activities. If you have firearms in your house, make sure you follow all gun safety procedures. Seek help if you have been physically or sexually abused. What's next? Visit your health care provider once a year for an annual wellness visit. Ask your health care provider how often you should have your eyes and teeth checked. Stay up to date on all vaccines. This information is not intended to replace advice given to you by your health care provider. Make sure you discuss any questions you have with your health care provider. Document Revised: 12/07/2020 Document Reviewed: 12/07/2020 Elsevier Patient Education  2024 Elsevier Inc. Breast Self-Awareness Breast self-awareness is knowing how your breasts look and feel. You need to: Check your breasts on a regular basis. Tell your doctor about any changes. Become familiar with the look and feel of your breasts. This can help you catch a breast problem while it is still small and can be treated. You should do breast self-exams even if you have breast implants. What you need: A mirror. A well-lit room. A pillow or other  soft object. How to do a breast self-exam Follow these steps to do a breast self-exam: Look for changes  Take off all the clothes above your waist. Stand in front of a mirror in a room with good lighting. Put your hands down at your sides. Compare your breasts in the mirror. Look for any difference between them, such as: A difference in shape. A difference in size. Wrinkles, dips, and bumps in one breast and not the other. Look at each breast for changes in the skin, such as: Redness. Scaly areas. Skin that has gotten thicker. Dimpling. Open sores (ulcers). Look for changes in your nipples, such as: Fluid coming out of a nipple. Fluid around a nipple. Bleeding. Dimpling. Redness. A nipple that looks pushed in (retracted), or that has changed position. Feel for changes Lie on your back. Feel each breast. To do this: Pick a breast to feel. Place a pillow under the shoulder closest to that breast. Put the arm closest to that breast behind your head. Feel the nipple area of that breast using the hand of your other arm. Feel the area with the pads of your three middle fingers by making small circles with your fingers. Use light, medium, and firm pressure. Continue the overlapping circles, moving downward over the breast. Keep making circles with your fingers. Stop when you feel your ribs. Start making circles with your fingers again, this time going   upward until you reach your collarbone. Then, make circles outward across your breast and into your armpit area. Squeeze your nipple. Check for discharge and lumps. Repeat these steps to check your other breast. Sit or stand in the tub or shower. With soapy water on your skin, feel each breast the same way you did when you were lying down. Write down what you find Writing down what you find can help you remember what to tell your doctor. Write down: What is normal for each breast. Any changes you find in each breast. These  include: The kind of changes you find. A tender or painful breast. Any lump you find. Write down its size and where it is. When you last had your monthly period (menstrual cycle). General tips If you are breastfeeding, the best time to check your breasts is after you feed your baby or after you use a breast pump. If you get monthly bleeding, the best time to check your breasts is 5-7 days after your monthly cycle ends. With time, you will become comfortable with the self-exam. You will also start to know if there are changes in your breasts. Contact a doctor if: You see a change in the shape or size of your breasts or nipples. You see a change in the skin of your breast or nipples, such as red or scaly skin. You have fluid coming from your nipples that is not normal. You find a new lump or thick area. You have breast pain. You have any concerns about your breast health. Summary Breast self-awareness includes looking for changes in your breasts and feeling for changes within your breasts. You should do breast self-awareness in front of a mirror in a well-lit room. If you get monthly periods (menstrual cycles), the best time to check your breasts is 5-7 days after your period ends. Tell your doctor about any changes you see in your breasts. Changes include changes in size, changes on the skin, painful or tender breasts, or fluid from your nipples that is not normal. This information is not intended to replace advice given to you by your health care provider. Make sure you discuss any questions you have with your health care provider. Document Revised: 11/16/2021 Document Reviewed: 04/13/2021 Elsevier Patient Education  2024 Elsevier Inc.  

## 2023-05-10 LAB — TESTOSTERONE: Testosterone: 21 ng/dL (ref 4–50)

## 2023-05-10 LAB — TSH: TSH: 1.18 u[IU]/mL (ref 0.450–4.500)

## 2023-05-10 LAB — FOLLICLE STIMULATING HORMONE: FSH: 5.9 m[IU]/mL

## 2023-05-10 LAB — ESTRADIOL: Estradiol: 101 pg/mL

## 2023-05-28 ENCOUNTER — Other Ambulatory Visit: Payer: Self-pay | Admitting: Certified Nurse Midwife

## 2023-05-28 ENCOUNTER — Telehealth: Payer: Self-pay | Admitting: Certified Nurse Midwife

## 2023-05-28 DIAGNOSIS — R102 Pelvic and perineal pain: Secondary | ICD-10-CM

## 2023-05-28 NOTE — Telephone Encounter (Signed)
Patient contacted office back to inquire about order for ultrasound. Patient states that she is requesting a pelvic ultrasound for ovarian pain? Patient was last seen in office for annual on 05/09/23, I reviewed your office note I dont see that you mentioned patient complaining of pelvic pain. Please advise if appropriate to order? KW

## 2023-05-28 NOTE — Telephone Encounter (Signed)
Patient call this morning stating that she is suppose to have a Ultra Sound ordered  due to pain on the right side.  Will you please check to see if you placed the orders?  Please advise the patient.   CJ

## 2023-05-29 NOTE — Telephone Encounter (Signed)
Patient has been advised and directed to centralizing scheduling to have imaging done at [facility] since schedule in office is booked out for next two weeks. Patient verbalized understanding. KW

## 2023-05-31 ENCOUNTER — Ambulatory Visit
Admission: RE | Admit: 2023-05-31 | Discharge: 2023-05-31 | Disposition: A | Discharge status: Home / Self Care | Payer: BC Managed Care – PPO | Source: Ambulatory Visit | Attending: Certified Nurse Midwife | Admitting: Certified Nurse Midwife

## 2023-05-31 DIAGNOSIS — R102 Pelvic and perineal pain: Secondary | ICD-10-CM | POA: Diagnosis present

## 2023-06-02 ENCOUNTER — Encounter: Payer: Self-pay | Admitting: Certified Nurse Midwife

## 2023-07-16 ENCOUNTER — Encounter: Payer: BC Managed Care – PPO | Admitting: Family Medicine

## 2023-08-29 ENCOUNTER — Other Ambulatory Visit: Payer: Self-pay | Admitting: Family Medicine

## 2023-08-29 DIAGNOSIS — F410 Panic disorder [episodic paroxysmal anxiety] without agoraphobia: Secondary | ICD-10-CM

## 2023-08-29 DIAGNOSIS — F411 Generalized anxiety disorder: Secondary | ICD-10-CM

## 2023-09-04 ENCOUNTER — Ambulatory Visit (INDEPENDENT_AMBULATORY_CARE_PROVIDER_SITE_OTHER): Payer: BC Managed Care – PPO | Admitting: Family Medicine

## 2023-09-04 ENCOUNTER — Encounter: Payer: Self-pay | Admitting: Family Medicine

## 2023-09-04 VITALS — BP 126/82 | HR 84 | Temp 98.0°F | Ht 64.5 in | Wt 166.2 lb

## 2023-09-04 DIAGNOSIS — Z131 Encounter for screening for diabetes mellitus: Secondary | ICD-10-CM

## 2023-09-04 DIAGNOSIS — Z0000 Encounter for general adult medical examination without abnormal findings: Secondary | ICD-10-CM | POA: Diagnosis not present

## 2023-09-04 DIAGNOSIS — F411 Generalized anxiety disorder: Secondary | ICD-10-CM | POA: Diagnosis not present

## 2023-09-04 DIAGNOSIS — F410 Panic disorder [episodic paroxysmal anxiety] without agoraphobia: Secondary | ICD-10-CM | POA: Diagnosis not present

## 2023-09-04 DIAGNOSIS — K529 Noninfective gastroenteritis and colitis, unspecified: Secondary | ICD-10-CM | POA: Insufficient documentation

## 2023-09-04 DIAGNOSIS — E7800 Pure hypercholesterolemia, unspecified: Secondary | ICD-10-CM | POA: Diagnosis not present

## 2023-09-04 DIAGNOSIS — F5104 Psychophysiologic insomnia: Secondary | ICD-10-CM

## 2023-09-04 DIAGNOSIS — F331 Major depressive disorder, recurrent, moderate: Secondary | ICD-10-CM

## 2023-09-04 MED ORDER — LORAZEPAM 0.5 MG PO TABS
0.5000 mg | ORAL_TABLET | Freq: Two times a day (BID) | ORAL | 1 refills | Status: AC | PRN
Start: 2023-09-04 — End: ?

## 2023-09-04 MED ORDER — BUPROPION HCL ER (SR) 200 MG PO TB12: 200.0000 mg | ORAL_TABLET | Freq: Every day | ORAL | 3 refills | Status: AC

## 2023-09-04 NOTE — Assessment & Plan Note (Addendum)
Continue PRN lorazepam

## 2023-09-04 NOTE — Assessment & Plan Note (Addendum)
 Chronic, off meds. Not fasting today for FLP - will return for this. The 10-year ASCVD risk score (Arnett DK, et al., 2019) is: 0.4%   Values used to calculate the score:     Age: 43 years     Sex: Female     Is Non-Hispanic African American: No     Diabetic: No     Tobacco smoker: No     Systolic Blood Pressure: 126 mmHg     Is BP treated: No     HDL Cholesterol: 78.8 mg/dL     Total Cholesterol: 207 mg/dL

## 2023-09-04 NOTE — Assessment & Plan Note (Addendum)
 Sleep maintenance - reviewed sleep hygiene measures, suggest trial L-theanine PRN for sleep awakenings.

## 2023-09-04 NOTE — Assessment & Plan Note (Addendum)
 Notes worsening anxiety, inattention, emotionality recently.  Will increase wellbutrin SR to 200mg  daily, continue sparing PRN lorazepam.

## 2023-09-04 NOTE — Assessment & Plan Note (Signed)
See above. Anxiety > depression

## 2023-09-04 NOTE — Patient Instructions (Addendum)
 Consider L-theanine amino acid 200mg  supplement as needed for night time awakenings.  Increase wellbutrin SR to 200mg  daily in am. Update me with effect.  Let me know if interested in trial of questran (bile acid sequestrant) for post-gallbladder surgery diarrhea Good to see you today  Return as needed or in 1 year for next physical

## 2023-09-04 NOTE — Progress Notes (Addendum)
 Ph: [phone] Fax: [phone]   Patient ID: Sherry Lara, female    DOB: 11-16-1980, 43 y.o.   MRN: [id number]  This visit was conducted in person.  BP 126/82   Pulse 84   Temp 98 F (36.7 C) (Oral)   Ht 5' 4.5" (1.638 m)   Wt 166 lb 4 oz (75.4 kg)   SpO2 98%   BMI 28.10 kg/m    CC: CPE Subjective:   HPI: Sherry Lara is a 43 y.o. female presenting on 09/04/2023 for Annual Exam   Busy time - kids looking into colleges.   GAD - more tearful lately, more trouble concentrating recently, trouble staying asleep, increased fatigue.  Remote Prozac was not helpful.  Perimenopausal symptoms - started OCP with benefit  C diff infection 01/2021 - treated with oral vancomycin course with resolution. No recurrent diarrheal illness.   Chronic loose stools since gallbladder surgery, worsening over last few years. Worse with greasy/fatty foods. No abd pain, no blood in stool.   Preventative: Requests yearly physical for insurance, will bring form  Well woman - yearly with Dr Greggory Keen / Doreene Burke CNM [facility], normal paps, last seen 04/2023.  Flu shot yearly COVID vaccine - Pfizer 2/201, 08/2019, booster 03/2020 and 2 more Tetanus - 06/2014 Seat belt use discussed Sunscreen use discussed. No changing moles on skin. Sees derm yearly (Dr Adolphus Birchwood).  Sleep - averaging 5-8 hours/night  Non smoker Alcohol - occasional wine Dentist q6 mo Eye exam yearly   Lives with husband and 3 children, 1 dog  Occupation: psychologist/counselor works at [employer]  Edu: [employer]  Activity: weight lifting, power yoga  Diet: good water, fruits/vegetables daily. One daughter is vegetarian      Relevant past medical, surgical, family and social history reviewed and updated as indicated. Interim medical history since our last visit reviewed. Allergies and medications reviewed and updated. Outpatient Medications Prior to Visit  Medication Sig Dispense Refill    Norethindrone Acetate-Ethinyl Estradiol (JUNEL 1.5/30) 1.5-30 MG-MCG tablet Take 1 tablet by mouth daily. 28 tablet 11   buPROPion (WELLBUTRIN SR) 150 MG 12 hr tablet TAKE 1 TABLET BY MOUTH EVERY DAY 90 tablet 0   LORazepam (ATIVAN) 0.5 MG tablet Take 1 tablet (0.5 mg total) by mouth 2 (two) times daily as needed for anxiety. 30 tablet 1   No facility-administered medications prior to visit.     Per HPI unless specifically indicated in ROS section below Review of Systems  Constitutional:  Negative for activity change, appetite change, chills, fatigue, fever and unexpected weight change.  HENT:  Negative for hearing loss.   Eyes:  Negative for visual disturbance.  Respiratory:  Negative for cough, chest tightness, shortness of breath and wheezing.   Cardiovascular:  Negative for chest pain, palpitations and leg swelling.  Gastrointestinal:  Negative for abdominal distention, abdominal pain, blood in stool, constipation, diarrhea, nausea and vomiting.  Genitourinary:  Negative for difficulty urinating and hematuria.  Musculoskeletal:  Negative for arthralgias, myalgias and neck pain.  Skin:  Negative for rash.  Neurological:  Negative for dizziness, seizures, syncope and headaches.  Hematological:  Negative for adenopathy. Does not bruise/bleed easily.  Psychiatric/Behavioral:  Negative for dysphoric mood. The patient is nervous/anxious.     Objective:  BP 126/82   Pulse 84   Temp 98 F (36.7 C) (Oral)   Ht 5' 4.5" (1.638 m)   Wt 166 lb 4 oz (75.4 kg)   SpO2 98%   BMI 28.10  kg/m   Wt Readings from Last 3 Encounters:  09/04/23 166 lb 4 oz (75.4 kg)  05/09/23 163 lb 3.2 oz (74 kg)  11/27/22 155 lb (70.3 kg)      Physical Exam Vitals and nursing note reviewed.  Constitutional:      Appearance: Normal appearance. She is not ill-appearing.  HENT:     Head: Normocephalic and atraumatic.     Right Ear: Tympanic membrane, ear canal and external ear normal. There is no impacted  cerumen.     Left Ear: Tympanic membrane, ear canal and external ear normal. There is no impacted cerumen.     Mouth/Throat:     Mouth: Mucous membranes are moist.     Pharynx: Oropharynx is clear. No oropharyngeal exudate or posterior oropharyngeal erythema.  Eyes:     General:        Right eye: No discharge.        Left eye: No discharge.     Extraocular Movements: Extraocular movements intact.     Conjunctiva/sclera: Conjunctivae normal.     Pupils: Pupils are equal, round, and reactive to light.  Neck:     Thyroid: No thyroid mass or thyromegaly.  Cardiovascular:     Rate and Rhythm: Normal rate and regular rhythm.     Pulses: Normal pulses.     Heart sounds: Normal heart sounds. No murmur heard. Pulmonary:     Effort: Pulmonary effort is normal. No respiratory distress.     Breath sounds: Normal breath sounds. No wheezing, rhonchi or rales.  Abdominal:     General: Bowel sounds are normal. There is no distension.     Palpations: Abdomen is soft. There is no mass.     Tenderness: There is no abdominal tenderness. There is no guarding or rebound.     Hernia: No hernia is present.  Musculoskeletal:     Cervical back: Normal range of motion and neck supple. No rigidity.     Right lower leg: No edema.     Left lower leg: No edema.  Lymphadenopathy:     Cervical: No cervical adenopathy.  Skin:    General: Skin is warm and dry.     Findings: No rash.  Neurological:     General: No focal deficit present.     Mental Status: She is alert. Mental status is at baseline.  Psychiatric:        Mood and Affect: Mood normal.        Behavior: Behavior normal.       Results for orders placed or performed in visit on 05/09/23  TSH   Collection Time: 05/09/23  2:20 PM  Result Value Ref Range   TSH 1.180 0.450 - 4.500 uIU/mL  FSH   Collection Time: 05/09/23  2:20 PM  Result Value Ref Range   FSH 5.9 mIU/mL  Testosterone   Collection Time: 05/09/23  2:20 PM  Result Value Ref  Range   Testosterone 21 4 - 50 ng/dL  Estradiol   Collection Time: 05/09/23  2:20 PM  Result Value Ref Range   Estradiol 101.0 pg/mL   Lab Results  Component Value Date   CHOL 207 (H) 06/04/2022   HDL 78.80 06/04/2022   LDLCALC 112 (H) 06/04/2022   TRIG 80.0 06/04/2022   CHOLHDL 3 06/04/2022    Lab Results  Component Value Date   NA 138 06/04/2022   CL 101 06/04/2022   K 3.9 06/04/2022   CO2 29 06/04/2022   BUN 14  06/04/2022   CREATININE 0.84 06/04/2022   GFR 85.46 06/04/2022   CALCIUM 9.9 06/04/2022   ALBUMIN 4.3 02/22/2021   GLUCOSE 97 06/04/2022    Lab Results  Component Value Date   ALT 13 02/22/2021   AST 18 02/22/2021   ALKPHOS 45 02/22/2021   BILITOT 0.8 02/22/2021     Assessment & Plan:   Problem List Items Addressed This Visit     Health maintenance examination - Primary (Chronic)   Preventative protocols reviewed and updated unless pt declined. Discussed healthy diet and lifestyle.       Moderate episode of recurrent major depressive disorder (HCC)   See above. Anxiety > depression      Relevant Medications   buPROPion (WELLBUTRIN SR) 200 MG 12 hr tablet   LORazepam (ATIVAN) 0.5 MG tablet   Panic attacks   Continue PRN lorazepam      Relevant Medications   buPROPion (WELLBUTRIN SR) 200 MG 12 hr tablet   LORazepam (ATIVAN) 0.5 MG tablet   GAD (generalized anxiety disorder)   Notes worsening anxiety, inattention, emotionality recently.  Will increase wellbutrin SR to 200mg  daily, continue sparing PRN lorazepam.       Relevant Medications   buPROPion (WELLBUTRIN SR) 200 MG 12 hr tablet   LORazepam (ATIVAN) 0.5 MG tablet   Hyperlipidemia   Chronic, off meds. Not fasting today for FLP - will return for this. The 10-year ASCVD risk score (Arnett DK, et al., 2019) is: 0.4%   Values used to calculate the score:     Age: 54 years     Sex: Female     Is Non-Hispanic African American: No     Diabetic: No     Tobacco smoker: No     Systolic  Blood Pressure: 126 mmHg     Is BP treated: No     HDL Cholesterol: 78.8 mg/dL     Total Cholesterol: 207 mg/dL       Relevant Orders   Lipid panel   Chronic diarrhea   Chronic loose stools since gallbladder removal, worse with fatty meals.  Discussed bile acid sequestrant treatment option - she will investigate.      Chronic insomnia   Sleep maintenance - reviewed sleep hygiene measures, suggest trial L-theanine PRN for sleep awakenings.      Other Visit Diagnoses       Diabetes mellitus screening       Relevant Orders   Basic metabolic panel        Meds ordered this encounter  Medications   buPROPion (WELLBUTRIN SR) 200 MG 12 hr tablet    Sig: Take 1 tablet (200 mg total) by mouth daily.    Dispense:  90 tablet    Refill:  3    Note new dose   LORazepam (ATIVAN) 0.5 MG tablet    Sig: Take 1 tablet (0.5 mg total) by mouth 2 (two) times daily as needed for anxiety.    Dispense:  30 tablet    Refill:  1    Orders Placed This Encounter  Procedures   Lipid panel    Standing Status:   Future    Expiration Date:   09/03/2024   Basic metabolic panel    Standing Status:   Future    Expiration Date:   09/03/2024    Patient Instructions  Consider L-theanine amino acid 200mg  supplement as needed for night time awakenings.  Increase wellbutrin SR to 200mg  daily in am. Update me with effect.  Let me know if interested in trial of questran (bile acid sequestrant) for post-gallbladder surgery diarrhea Good to see you today  Return as needed or in 1 year for next physical   Follow up plan: Return in about 1 year (around 09/03/2024), or if symptoms worsen or fail to improve, for annual exam, prior fasting for blood work.  Eustaquio Boyden, MD

## 2023-09-04 NOTE — Assessment & Plan Note (Signed)
 Preventative protocols reviewed and updated unless pt declined. Discussed healthy diet and lifestyle.

## 2023-09-04 NOTE — Assessment & Plan Note (Addendum)
 Chronic loose stools since gallbladder removal, worse with fatty meals.  Discussed bile acid sequestrant treatment option - she will investigate.

## 2023-09-06 ENCOUNTER — Other Ambulatory Visit (INDEPENDENT_AMBULATORY_CARE_PROVIDER_SITE_OTHER)

## 2023-09-06 DIAGNOSIS — E7800 Pure hypercholesterolemia, unspecified: Secondary | ICD-10-CM | POA: Diagnosis not present

## 2023-09-06 DIAGNOSIS — Z131 Encounter for screening for diabetes mellitus: Secondary | ICD-10-CM | POA: Diagnosis not present

## 2023-09-06 LAB — BASIC METABOLIC PANEL
BUN: 12 mg/dL (ref 6–23)
CO2: 27 meq/L (ref 19–32)
Calcium: 9.3 mg/dL (ref 8.4–10.5)
Chloride: 99 meq/L (ref 96–112)
Creatinine, Ser: 0.86 mg/dL (ref 0.40–1.20)
GFR: 82.35 mL/min (ref >60.00)
Glucose, Bld: 90 mg/dL (ref 70–99)
Potassium: 4 meq/L (ref 3.5–5.1)
Sodium: 134 meq/L — ABNORMAL LOW (ref 135–145)

## 2023-09-06 LAB — LIPID PANEL
Cholesterol: 169 mg/dL (ref 0–200)
HDL: 58.4 mg/dL (ref >39.00)
LDL Cholesterol: 90 mg/dL (ref 0–99)
NonHDL: 110.38
Total CHOL/HDL Ratio: 3
Triglycerides: 101 mg/dL (ref 0.0–149.0)
VLDL: 20.2 mg/dL (ref 0.0–40.0)

## 2023-09-09 ENCOUNTER — Encounter: Payer: Self-pay | Admitting: Family Medicine

## 2024-04-23 ENCOUNTER — Other Ambulatory Visit: Payer: Self-pay | Admitting: Certified Nurse Midwife

## 2024-04-27 ENCOUNTER — Other Ambulatory Visit: Payer: Self-pay | Admitting: Certified Nurse Midwife

## 2024-04-27 DIAGNOSIS — Z1231 Encounter for screening mammogram for malignant neoplasm of breast: Secondary | ICD-10-CM

## 2024-06-02 ENCOUNTER — Encounter

## 2024-06-23 ENCOUNTER — Ambulatory Visit
Admission: RE | Admit: 2024-06-23 | Discharge: 2024-06-23 | Disposition: A | Discharge status: Home / Self Care | Source: Ambulatory Visit | Attending: Certified Nurse Midwife | Admitting: Certified Nurse Midwife

## 2024-06-23 DIAGNOSIS — Z1231 Encounter for screening mammogram for malignant neoplasm of breast: Secondary | ICD-10-CM | POA: Diagnosis present

## 2024-07-18 ENCOUNTER — Other Ambulatory Visit: Payer: Self-pay | Admitting: Certified Nurse Midwife

## 2024-09-01 ENCOUNTER — Ambulatory Visit: Admitting: Certified Nurse Midwife

## 2024-09-07 ENCOUNTER — Encounter: Admitting: Family Medicine
